# Patient Record
Sex: Female | Born: 1960
Health system: Southern US, Community
[De-identification: ages and names within clinical notes are randomized; demographics above are authoritative.]

## PROBLEM LIST (undated history)

## (undated) DIAGNOSIS — Z8739 Personal history of other diseases of the musculoskeletal system and connective tissue: Secondary | ICD-10-CM

## (undated) DIAGNOSIS — T2200XA Burn of unspecified degree of shoulder and upper limb, except wrist and hand, unspecified site, initial encounter: Secondary | ICD-10-CM

## (undated) DIAGNOSIS — Z972 Presence of dental prosthetic device (complete) (partial): Secondary | ICD-10-CM

## (undated) DIAGNOSIS — I10 Essential (primary) hypertension: Secondary | ICD-10-CM

## (undated) DIAGNOSIS — N6099 Unspecified benign mammary dysplasia of unspecified breast: Secondary | ICD-10-CM

## (undated) DIAGNOSIS — Z98811 Dental restoration status: Secondary | ICD-10-CM

## (undated) DIAGNOSIS — M1710 Unilateral primary osteoarthritis, unspecified knee: Secondary | ICD-10-CM

---
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
## 1999-01-25 ENCOUNTER — Other Ambulatory Visit: Admission: RE | Admit: 1999-01-25 | Discharge: 1999-01-25 | Payer: Self-pay | Admitting: Obstetrics & Gynecology

## 2000-04-02 ENCOUNTER — Other Ambulatory Visit: Admission: RE | Admit: 2000-04-02 | Discharge: 2000-04-02 | Payer: Self-pay | Admitting: Family Medicine

## 2001-08-20 ENCOUNTER — Other Ambulatory Visit: Admission: RE | Admit: 2001-08-20 | Discharge: 2001-08-20 | Payer: Self-pay | Admitting: Obstetrics & Gynecology

## 2002-09-15 ENCOUNTER — Other Ambulatory Visit: Admission: RE | Admit: 2002-09-15 | Discharge: 2002-09-15 | Payer: Self-pay | Admitting: Obstetrics & Gynecology

## 2004-05-01 ENCOUNTER — Other Ambulatory Visit: Admission: RE | Admit: 2004-05-01 | Discharge: 2004-05-01 | Payer: Self-pay | Admitting: Obstetrics & Gynecology

## 2005-05-11 ENCOUNTER — Other Ambulatory Visit: Admission: RE | Admit: 2005-05-11 | Discharge: 2005-05-11 | Payer: Self-pay | Admitting: Obstetrics & Gynecology

## 2010-08-13 HISTORY — PX: BREAST BIOPSY: SHX20

## 2011-05-14 DIAGNOSIS — M179 Osteoarthritis of knee, unspecified: Secondary | ICD-10-CM

## 2011-05-14 DIAGNOSIS — M1710 Unilateral primary osteoarthritis, unspecified knee: Secondary | ICD-10-CM

## 2011-05-14 HISTORY — PX: JOINT REPLACEMENT: SHX530

## 2011-05-14 HISTORY — DX: Osteoarthritis of knee, unspecified: M17.9

## 2011-05-14 HISTORY — DX: Unilateral primary osteoarthritis, unspecified knee: M17.10

## 2011-12-31 ENCOUNTER — Other Ambulatory Visit: Payer: Self-pay | Admitting: Obstetrics & Gynecology

## 2011-12-31 DIAGNOSIS — R928 Other abnormal and inconclusive findings on diagnostic imaging of breast: Secondary | ICD-10-CM

## 2012-01-11 ENCOUNTER — Ambulatory Visit
Admission: RE | Admit: 2012-01-11 | Discharge: 2012-01-11 | Disposition: A | Discharge status: Home / Self Care | Payer: BC Managed Care – PPO | Source: Ambulatory Visit | Attending: Obstetrics & Gynecology | Admitting: Obstetrics & Gynecology

## 2012-01-11 ENCOUNTER — Other Ambulatory Visit: Payer: Self-pay | Admitting: Obstetrics & Gynecology

## 2012-01-11 DIAGNOSIS — R928 Other abnormal and inconclusive findings on diagnostic imaging of breast: Secondary | ICD-10-CM

## 2012-01-11 DIAGNOSIS — N6310 Unspecified lump in the right breast, unspecified quadrant: Secondary | ICD-10-CM

## 2012-02-22 ENCOUNTER — Ambulatory Visit
Admission: RE | Admit: 2012-02-22 | Discharge: 2012-02-22 | Disposition: A | Discharge status: Home / Self Care | Payer: BC Managed Care – PPO | Source: Ambulatory Visit | Attending: Obstetrics & Gynecology | Admitting: Obstetrics & Gynecology

## 2012-02-22 ENCOUNTER — Other Ambulatory Visit: Payer: Self-pay | Admitting: Obstetrics & Gynecology

## 2012-02-22 DIAGNOSIS — N6310 Unspecified lump in the right breast, unspecified quadrant: Secondary | ICD-10-CM

## 2012-03-07 ENCOUNTER — Ambulatory Visit
Admission: RE | Admit: 2012-03-07 | Discharge: 2012-03-07 | Disposition: A | Discharge status: Home / Self Care | Payer: BC Managed Care – PPO | Source: Ambulatory Visit | Attending: Obstetrics & Gynecology | Admitting: Obstetrics & Gynecology

## 2012-03-07 DIAGNOSIS — N6310 Unspecified lump in the right breast, unspecified quadrant: Secondary | ICD-10-CM

## 2012-04-02 ENCOUNTER — Ambulatory Visit (INDEPENDENT_AMBULATORY_CARE_PROVIDER_SITE_OTHER): Payer: BC Managed Care – PPO | Admitting: Surgery

## 2012-04-02 ENCOUNTER — Encounter (INDEPENDENT_AMBULATORY_CARE_PROVIDER_SITE_OTHER): Payer: Self-pay | Admitting: Surgery

## 2012-04-02 ENCOUNTER — Encounter (INDEPENDENT_AMBULATORY_CARE_PROVIDER_SITE_OTHER): Payer: Self-pay

## 2012-04-02 ENCOUNTER — Other Ambulatory Visit (INDEPENDENT_AMBULATORY_CARE_PROVIDER_SITE_OTHER): Payer: Self-pay | Admitting: Surgery

## 2012-04-02 VITALS — BP 104/78 | HR 78 | Temp 98.8°F | Resp 16 | Ht 63.5 in | Wt 175.2 lb

## 2012-04-02 DIAGNOSIS — N6099 Unspecified benign mammary dysplasia of unspecified breast: Secondary | ICD-10-CM

## 2012-04-02 DIAGNOSIS — N6089 Other benign mammary dysplasias of unspecified breast: Secondary | ICD-10-CM

## 2012-04-02 NOTE — Patient Instructions (Signed)
Lumpectomy, Breast Conserving Surgery Care After Please read the instructions outlined below and refer to this sheet in the next few weeks. These discharge instructions provide you with general information on caring for yourself after you leave the hospital. Your surgeon may also give you specific instructions. While your treatment has been planned according to the most current medical practices available, unavoidable complications occasionally occur. If you have any problems or questions after discharge, please call your surgeon. Reasons for a lumpectomy: Any solid breast mass.  Grouped significant nodularity that may be confused with a solitary breast mass.  AFTER THE PROCEDURE After surgery, you will be taken to the recovery area where a nurse will watch and check your progress. Once you're awake, stable, and taking fluids well, barring other problems you will be allowed to go home.  Ice packs applied to your operative site may help with discomfort and keep the swelling down.  A small rubber drain may be placed in the incision for a couple of days to prevent a hematoma in the breast.  A pressure dressing may be applied for 24 to 48 hours to prevent bleeding.  Keep the wound dry.  You may resume a normal diet and activities as directed. Avoid strenuous activities affecting the arm on the side of the biopsy site such as tennis, swimming, heavy lifting (more than 10 pounds) or pulling.  Bruising in the breast is normal following this procedure.  Wearing a bra - even to bed - may be more comfortable and also help keep the dressing on.  Change dressings as directed.  Only take over-the-counter or prescription medicines for pain, discomfort, or fever as directed by your caregiver.  Call for your results as instructed by your surgeon. Remember it isyour responsibility to get the results of your lumpectomy if your surgeon asked you to follow-up. Do not assume everything is fine if you have not heard from  your caregiver. SEEK MEDICAL CARE IF:  There is increased bleeding (more than a small spot) from the wound.  You notice redness, swelling, or increasing pain in the wound.  Pus is coming from wound.  An unexplained oral temperature above 102 F (38.9 C) develops.  You notice a foul smell coming from the wound or dressing.  SEEK IMMEDIATE MEDICAL CARE IF:  You develop a rash.  You have difficulty breathing.  You have any allergic problems.  Document Released: 08/15/2006 Document Revised: 07/19/2011 Document Reviewed: 07/18/2007 [employer] Patient Information 2012 [employer].Lumpectomy, Breast Conserving Surgery A lumpectomy is breast surgery that removes only part of the breast. Another name used may be partial mastectomy. The amount removed varies. Make sure you understand how much of your breast will be removed. Reasons for a lumpectomy:  Any solid breast mass.   Grouped significant nodularity that may be confused with a solitary breast mass.  Lumpectomy is the most common form of breast cancer surgery today. The surgeon removes the portion of your breast which contains the tumor (cancer). This is the lump. Some normal tissue around the lump is also removed to be sure that all the tumor has been removed.  If cancer cells are found in the margins where the breast tissue was removed, your surgeon will do more surgery to remove the remaining cancer tissue. This is called re-excision surgery. Radiation and/or chemotherapy treatments are often given following a lumpectomy to kill any cancer cells that could possibly remain.  REASONS YOU MAY NOT BE ABLE TO HAVE BREAST CONSERVING SURGERY:  The   tumor is located in more than one place.   Your breast is small and the tumor is large so the breast would be disfigured.   The entire tumor removal is not successful with a lumpectomy.   You cannot commit to a full course of chemotherapy, radiation therapy or are pregnant and cannot have radiation.     You have previously had radiation to the breast to treat cancer.  HOW A LUMPECTOMY IS PERFORMED If overnight nursing is not required following a biopsy, a lumpectomy can be performed as a same-day surgery. This can be done in a hospital, clinic, or surgical center. The anesthesia used will depend on your surgeon. They will discuss this with you. A general anesthetic keeps you sleeping through the procedure. LET YOUR CAREGIVERS KNOW ABOUT THE FOLLOWING:  Allergies   Medications taken including herbs, eye drops, over the counter medications, and creams.   Use of steroids (by mouth or creams)   Previous problems with anesthetics or Novocaine.   Possibility of pregnancy, if this applies   History of blood clots (thrombophlebitis)   History of bleeding or blood problems.   Previous surgery   Other health problems  BEFORE THE PROCEDURE You should be present one hour prior to your procedure unless directed otherwise.  AFTER THE PROCEDURE  After surgery, you will be taken to the recovery area where a nurse will watch and check your progress. Once you're awake, stable, and taking fluids well, barring other problems you will be allowed to go home.   Ice packs applied to your operative site may help with discomfort and keep the swelling down.   A small rubber drain may be placed in the breast for a couple of days to prevent a hematoma from developing in the breast.   A pressure dressing may be applied for 24 to 48 hours to prevent bleeding.   Keep the wound dry.   You may resume a normal diet and activities as directed. Avoid strenuous activities affecting the arm on the side of the biopsy site such as tennis, swimming, heavy lifting (more than 10 pounds) or pulling.   Bruising in the breast is normal following this procedure.   Wearing a bra - even to bed - may be more comfortable and also help keep the dressing on.   Change dressings as directed.   Only take over-the-counter or  prescription medicines for pain, discomfort, or fever as directed by your caregiver.  Call for your results as instructed by your surgeon. Remember it is your responsibility to get the results of your lumpectomy if your surgeon asked you to follow-up. Do not assume everything is fine if you have not heard from your caregiver. SEEK MEDICAL CARE IF:   There is increased bleeding (more than a small spot) from the wound.   You notice redness, swelling, or increasing pain in the wound.   Pus is coming from wound.   An unexplained oral temperature above 102 F (38.9 C) develops.   You notice a foul smell coming from the wound or dressing.  SEEK IMMEDIATE MEDICAL CARE IF:   You develop a rash.   You have difficulty breathing.   You have any allergic problems.  Document Released: 09/10/2006 Document Revised: 07/19/2011 Document Reviewed: 12/12/2006 [employer] Patient Information 2012 [employer]. 

## 2012-04-02 NOTE — Progress Notes (Signed)
Patient ID: Olivia Hall, female   DOB: [birth year], 52 y.o.   MRN: [id number]  Chief Complaint  Patient presents with  . Pre-op Exam    eval rt atypical hyperplasia    HPI Olivia Hall is a 51 y.o. female.  The patient is sent at the request of Dr.Rubner for abnormal mammogram. She was found to have an area in her right breast that was atypical in appearance and core biopsy was done which showed atypical ductal hyperplasia. Patient denies any history of breast mass, breast pain or nipple discharge bilaterally. No family history of previous biopsy or breast cancer. HPI  History reviewed. No pertinent past medical history.  Past Surgical History  Procedure Date  . Back surgery   . Joint replacement 05/2011    knee    Family History  Problem Relation Age of Onset  . Heart disease Father   . Cancer Father     brain    Social History History  Substance Use Topics  . Smoking status: Never Smoker   . Smokeless tobacco: Never Used  . Alcohol Use: No    No Known Allergies  Current Outpatient Prescriptions  Medication Sig Dispense Refill  . pregabalin (LYRICA) 100 MG capsule Take 100 mg by mouth 2 (two) times daily. Pt does not remember correct dosage        Review of Systems Review of Systems  Constitutional: Negative for fever, chills and unexpected weight change.  HENT: Negative for hearing loss, congestion, sore throat, trouble swallowing and voice change.   Eyes: Negative for visual disturbance.  Respiratory: Negative for cough and wheezing.   Cardiovascular: Negative for chest pain, palpitations and leg swelling.  Gastrointestinal: Negative for nausea, vomiting, abdominal pain, diarrhea, constipation, blood in stool, abdominal distention and anal bleeding.  Genitourinary: Negative for hematuria, vaginal bleeding and difficulty urinating.  Musculoskeletal: Negative for arthralgias.  Skin: Negative for rash and wound.  Neurological: Negative for seizures, syncope  and headaches.  Hematological: Negative for adenopathy. Does not bruise/bleed easily.  Psychiatric/Behavioral: Negative for confusion.    Blood pressure 104/78, pulse 78, temperature 98.8 F (37.1 C), temperature source Temporal, resp. rate 16, height 5' 3.5" (1.613 m), weight 175 lb 3.2 oz (79.47 kg), last menstrual period 03/22/2012.  Physical Exam Physical Exam  Constitutional: She is oriented to person, place, and time. She appears well-developed and well-nourished.  HENT:  Head: Normocephalic and atraumatic.  Eyes: EOM are normal. Pupils are equal, round, and reactive to light.  Neck: Normal range of motion. Neck supple.  Cardiovascular: Normal rate and regular rhythm.   Pulmonary/Chest: Effort normal and breath sounds normal.    Musculoskeletal: Normal range of motion.  Lymphadenopathy:    She has no axillary adenopathy.  Neurological: She is alert and oriented to person, place, and time.  Skin: Skin is warm and dry.  Psychiatric: She has a normal mood and affect. Her behavior is normal. Judgment and thought content normal.    Data Reviewed  **ADDENDUM** CREATED: 03/10/2012 12:28:35  Atypical ductal hyperplasia, possibly involving an underlying  papilloma was reported histologically. This corresponds well with  the imaging findings. The patient was contacted by telephone and  given the results of the biopsy. She states the wound site is  clean and dry with no significant hematoma. Surgical consultation  has been scheduled with Dr. Luisa Hart on 04/02/2012.  Addended by: Littie Deeds. Judyann Munson, M.D. on 03/10/2012 12:28:35.  **END ADDENDUM** SIGNED BY: Littie Deeds. Judyann Munson, M.D.  Study Result     *RADIOLOGY REPORT*  Clinical Data: mass 6:00 right breast  DIGITAL DIAGNOSTIC RIGHT MAMMOGRAM  Comparison: Previous exams  Findings: Films are performed following ultrasound guided biopsy  of the mass in the 6:00 position of the right breast. The marker  clip projects over the anticipated  area.  IMPRESSION:  Appropriate clip positioning.      Assessment    Right breast atypical ductal hyperplasia    Plan    Right breast needle localized lumpectomy.The procedure has been discussed with the patient. Alternatives to surgery have been discussed with the patient.  Risks of surgery include bleeding,  Infection,  Seroma formation, death,  and the need for further surgery.   The patient understands and wishes to proceed.       Ginevra Tacker A. 04/02/2012, 2:11 PM

## 2012-04-13 DIAGNOSIS — N6099 Unspecified benign mammary dysplasia of unspecified breast: Secondary | ICD-10-CM

## 2012-04-13 HISTORY — DX: Unspecified benign mammary dysplasia of unspecified breast: N60.99

## 2012-04-22 ENCOUNTER — Encounter (HOSPITAL_BASED_OUTPATIENT_CLINIC_OR_DEPARTMENT_OTHER): Payer: Self-pay | Admitting: *Deleted

## 2012-04-22 DIAGNOSIS — T2200XA Burn of unspecified degree of shoulder and upper limb, except wrist and hand, unspecified site, initial encounter: Secondary | ICD-10-CM

## 2012-04-22 HISTORY — DX: Burn of unspecified degree of shoulder and upper limb, except wrist and hand, unspecified site, initial encounter: T22.00XA

## 2012-04-22 NOTE — Pre-Procedure Instructions (Signed)
To come for CBC, diff, CMET, CXR 

## 2012-04-23 ENCOUNTER — Ambulatory Visit
Admission: RE | Admit: 2012-04-23 | Discharge: 2012-04-23 | Disposition: A | Discharge status: Home / Self Care | Payer: BC Managed Care – PPO | Source: Ambulatory Visit | Attending: Surgery | Admitting: Surgery

## 2012-04-23 ENCOUNTER — Encounter (HOSPITAL_BASED_OUTPATIENT_CLINIC_OR_DEPARTMENT_OTHER)
Admission: RE | Admit: 2012-04-23 | Discharge: 2012-04-23 | Disposition: A | Discharge status: Home / Self Care | Payer: BC Managed Care – PPO | Source: Ambulatory Visit | Attending: Surgery | Admitting: Surgery

## 2012-04-23 LAB — COMPREHENSIVE METABOLIC PANEL
ALT: 25 U/L (ref 0–35)
Alkaline Phosphatase: 88 U/L (ref 39–117)
BUN: 12 mg/dL (ref 6–23)
Chloride: 102 mEq/L (ref 96–112)
GFR calc Af Amer: >90 mL/min (ref >90)
Glucose, Bld: 84 mg/dL (ref 70–99)
Potassium: 3.8 mEq/L (ref 3.5–5.1)
Sodium: 139 mEq/L (ref 135–145)
Total Bilirubin: 0.3 mg/dL (ref 0.3–1.2)
Total Protein: 7.3 g/dL (ref 6.0–8.3)

## 2012-04-23 LAB — CBC WITH DIFFERENTIAL/PLATELET
Eosinophils Absolute: 0.1 10*3/uL (ref 0.0–0.7)
Hemoglobin: 14.1 g/dL (ref 12.0–15.0)
Lymphocytes Relative: 31 % (ref 12–46)
Lymphs Abs: 2.4 10*3/uL (ref 0.7–4.0)
Monocytes Relative: 9 % (ref 3–12)
Neutro Abs: 4.4 10*3/uL (ref 1.7–7.7)
Neutrophils Relative %: 58 % (ref 43–77)
Platelets: 293 10*3/uL (ref 150–400)
RBC: 4.7 MIL/uL (ref 3.87–5.11)
WBC: 7.6 10*3/uL (ref 4.0–10.5)

## 2012-04-28 ENCOUNTER — Ambulatory Visit (HOSPITAL_BASED_OUTPATIENT_CLINIC_OR_DEPARTMENT_OTHER): Payer: BC Managed Care – PPO | Admitting: Anesthesiology

## 2012-04-28 ENCOUNTER — Ambulatory Visit
Admission: RE | Admit: 2012-04-28 | Discharge: 2012-04-28 | Disposition: A | Discharge status: Home / Self Care | Payer: BC Managed Care – PPO | Source: Ambulatory Visit | Attending: Surgery | Admitting: Surgery

## 2012-04-28 ENCOUNTER — Encounter (HOSPITAL_BASED_OUTPATIENT_CLINIC_OR_DEPARTMENT_OTHER): Payer: Self-pay | Admitting: Anesthesiology

## 2012-04-28 ENCOUNTER — Encounter (HOSPITAL_BASED_OUTPATIENT_CLINIC_OR_DEPARTMENT_OTHER): Payer: Self-pay

## 2012-04-28 ENCOUNTER — Encounter (HOSPITAL_BASED_OUTPATIENT_CLINIC_OR_DEPARTMENT_OTHER)
Admission: RE | Disposition: A | Discharge status: Home / Self Care | Payer: Self-pay | Source: Ambulatory Visit | Attending: Surgery

## 2012-04-28 ENCOUNTER — Ambulatory Visit (HOSPITAL_BASED_OUTPATIENT_CLINIC_OR_DEPARTMENT_OTHER)
Admission: RE | Admit: 2012-04-28 | Discharge: 2012-04-28 | Disposition: A | Discharge status: Home / Self Care | Payer: BC Managed Care – PPO | Source: Ambulatory Visit | Attending: Surgery | Admitting: Surgery

## 2012-04-28 ENCOUNTER — Encounter (HOSPITAL_BASED_OUTPATIENT_CLINIC_OR_DEPARTMENT_OTHER): Payer: Self-pay | Admitting: Surgery

## 2012-04-28 DIAGNOSIS — N6099 Unspecified benign mammary dysplasia of unspecified breast: Secondary | ICD-10-CM

## 2012-04-28 DIAGNOSIS — N6089 Other benign mammary dysplasias of unspecified breast: Secondary | ICD-10-CM | POA: Insufficient documentation

## 2012-04-28 HISTORY — DX: Burn of unspecified degree of shoulder and upper limb, except wrist and hand, unspecified site, initial encounter: T22.00XA

## 2012-04-28 HISTORY — DX: Personal history of other diseases of the musculoskeletal system and connective tissue: Z87.39

## 2012-04-28 HISTORY — DX: Dental restoration status: Z98.811

## 2012-04-28 HISTORY — DX: Unspecified benign mammary dysplasia of unspecified breast: N60.99

## 2012-04-28 HISTORY — DX: Presence of dental prosthetic device (complete) (partial): Z97.2

## 2012-04-28 HISTORY — DX: Unilateral primary osteoarthritis, unspecified knee: M17.10

## 2012-04-28 SURGERY — BREAST LUMPECTOMY WITH NEEDLE LOCALIZATION
Anesthesia: General | Site: Breast | Laterality: Right | Wound class: Clean

## 2012-04-28 MED ORDER — PROMETHAZINE HCL 25 MG/ML IJ SOLN: 6.2500 mg | INTRAMUSCULAR | Status: DC | PRN

## 2012-04-28 MED ORDER — CHLORHEXIDINE GLUCONATE 4 % EX LIQD: 1.0000 "application " | Freq: Once | CUTANEOUS | Status: DC

## 2012-04-28 MED ORDER — BUPIVACAINE-EPINEPHRINE 0.25% -1:200000 IJ SOLN
INTRAMUSCULAR | Status: DC | PRN
  Administered 2012-04-28: 20 mL

## 2012-04-28 MED ORDER — DEXTROSE 5 % IV SOLN
3.0000 g | INTRAVENOUS | Status: AC
  Administered 2012-04-28: 3 g via INTRAVENOUS

## 2012-04-28 MED ORDER — OXYCODONE HCL 5 MG/5ML PO SOLN: 5.0000 mg | Freq: Once | ORAL | Status: AC | PRN

## 2012-04-28 MED ORDER — LACTATED RINGERS IV SOLN
INTRAVENOUS | Status: DC
  Administered 2012-04-28: 11:00:00 via INTRAVENOUS

## 2012-04-28 MED ORDER — SUCCINYLCHOLINE CHLORIDE 20 MG/ML IJ SOLN
INTRAMUSCULAR | Status: DC | PRN
  Administered 2012-04-28: 20 mg via INTRAVENOUS

## 2012-04-28 MED ORDER — MEPERIDINE HCL 25 MG/ML IJ SOLN: 6.2500 mg | INTRAMUSCULAR | Status: DC | PRN

## 2012-04-28 MED ORDER — PROPOFOL 10 MG/ML IV BOLUS
INTRAVENOUS | Status: DC | PRN
  Administered 2012-04-28: 200 mg via INTRAVENOUS

## 2012-04-28 MED ORDER — OXYCODONE-ACETAMINOPHEN 5-325 MG PO TABS: 1.0000 | ORAL_TABLET | ORAL | Status: DC | PRN

## 2012-04-28 MED ORDER — DEXAMETHASONE SODIUM PHOSPHATE 4 MG/ML IJ SOLN
INTRAMUSCULAR | Status: DC | PRN
  Administered 2012-04-28: 10 mg via INTRAVENOUS

## 2012-04-28 MED ORDER — LIDOCAINE HCL (CARDIAC) 20 MG/ML IV SOLN
INTRAVENOUS | Status: DC | PRN
  Administered 2012-04-28: 75 mg via INTRAVENOUS

## 2012-04-28 MED ORDER — FENTANYL CITRATE 0.05 MG/ML IJ SOLN
INTRAMUSCULAR | Status: DC | PRN
  Administered 2012-04-28 (×2): 50 ug via INTRAVENOUS

## 2012-04-28 MED ORDER — HYDROMORPHONE HCL PF 1 MG/ML IJ SOLN: 0.2500 mg | INTRAMUSCULAR | Status: DC | PRN

## 2012-04-28 MED ORDER — OXYCODONE HCL 5 MG PO TABS
5.0000 mg | ORAL_TABLET | Freq: Once | ORAL | Status: AC | PRN
  Administered 2012-04-28: 5 mg via ORAL

## 2012-04-28 MED ORDER — MIDAZOLAM HCL 5 MG/5ML IJ SOLN
INTRAMUSCULAR | Status: DC | PRN
  Administered 2012-04-28: 2 mg via INTRAVENOUS

## 2012-04-28 SURGICAL SUPPLY — 45 items
ADH SKN CLS APL DERMABOND .7 (GAUZE/BANDAGES/DRESSINGS) ×1
BLADE SURG 15 STRL LF DISP TIS (BLADE) ×1 IMPLANT
BLADE SURG 15 STRL SS (BLADE) ×2
CANISTER SUCTION 1200CC (MISCELLANEOUS) ×2 IMPLANT
CHLORAPREP W/TINT 26ML (MISCELLANEOUS) ×2 IMPLANT
CLIP TI WIDE RED SMALL 6 (CLIP) IMPLANT
CLOTH BEACON ORANGE TIMEOUT ST (SAFETY) ×2 IMPLANT
COVER MAYO STAND STRL (DRAPES) ×2 IMPLANT
COVER TABLE BACK 60X90 (DRAPES) ×2 IMPLANT
DECANTER SPIKE VIAL GLASS SM (MISCELLANEOUS) ×1 IMPLANT
DERMABOND ADVANCED (GAUZE/BANDAGES/DRESSINGS) ×1
DERMABOND ADVANCED .7 DNX12 (GAUZE/BANDAGES/DRESSINGS) ×1 IMPLANT
DEVICE DUBIN W/COMP PLATE 8390 (MISCELLANEOUS) ×1 IMPLANT
DRAPE LAPAROSCOPIC ABDOMINAL (DRAPES) IMPLANT
DRAPE PED LAPAROTOMY (DRAPES) ×2 IMPLANT
DRAPE UTILITY XL STRL (DRAPES) ×2 IMPLANT
ELECT COATED BLADE 2.86 ST (ELECTRODE) ×2 IMPLANT
ELECT REM PT RETURN 9FT ADLT (ELECTROSURGICAL) ×2
ELECTRODE REM PT RTRN 9FT ADLT (ELECTROSURGICAL) ×1 IMPLANT
GLOVE BIO SURGEON STRL SZ 6.5 (GLOVE) ×1 IMPLANT
GLOVE BIOGEL PI IND STRL 7.0 (GLOVE) IMPLANT
GLOVE BIOGEL PI IND STRL 8 (GLOVE) ×1 IMPLANT
GLOVE BIOGEL PI INDICATOR 7.0 (GLOVE) ×1
GLOVE BIOGEL PI INDICATOR 8 (GLOVE) ×2
GLOVE ECLIPSE 6.5 STRL STRAW (GLOVE) ×1 IMPLANT
GLOVE ECLIPSE 8.0 STRL XLNG CF (GLOVE) ×3 IMPLANT
GOWN PREVENTION PLUS XLARGE (GOWN DISPOSABLE) ×4 IMPLANT
KIT MARKER MARGIN INK (KITS) IMPLANT
NDL HYPO 25X1 1.5 SAFETY (NEEDLE) ×1 IMPLANT
NEEDLE HYPO 25X1 1.5 SAFETY (NEEDLE) ×2 IMPLANT
NS IRRIG 1000ML POUR BTL (IV SOLUTION) ×2 IMPLANT
PACK BASIN DAY SURGERY FS (CUSTOM PROCEDURE TRAY) ×2 IMPLANT
PENCIL BUTTON HOLSTER BLD 10FT (ELECTRODE) ×2 IMPLANT
SLEEVE SCD COMPRESS KNEE MED (MISCELLANEOUS) ×2 IMPLANT
SPONGE LAP 4X18 X RAY DECT (DISPOSABLE) ×2 IMPLANT
SUT MON AB 4-0 PC3 18 (SUTURE) ×2 IMPLANT
SUT SILK 2 0 SH (SUTURE) IMPLANT
SUT VIC AB 3-0 SH 27 (SUTURE) ×4
SUT VIC AB 3-0 SH 27X BRD (SUTURE) ×1 IMPLANT
SYR CONTROL 10ML LL (SYRINGE) ×2 IMPLANT
TOWEL OR 17X24 6PK STRL BLUE (TOWEL DISPOSABLE) ×4 IMPLANT
TOWEL OR NON WOVEN STRL DISP B (DISPOSABLE) ×2 IMPLANT
TUBE CONNECTING 20X1/4 (TUBING) ×2 IMPLANT
WATER STERILE IRR 1000ML POUR (IV SOLUTION) IMPLANT
YANKAUER SUCT BULB TIP NO VENT (SUCTIONS) ×2 IMPLANT

## 2012-04-28 NOTE — Interval H&P Note (Signed)
History and Physical Interval Note:  04/28/2012 11:26 AM  Olivia Hall  has presented today for surgery, with the diagnosis of atypical ductal hyperplasia  The various methods of treatment have been discussed with the patient and family. After consideration of risks, benefits and other options for treatment, the patient has consented to  Procedure(s) (LRB) with comments: BREAST LUMPECTOMY WITH NEEDLE LOCALIZATION (Right) - Right breast Needle localized lumpectomy as a surgical intervention .  The patient's history has been reviewed, patient examined, no change in status, stable for surgery.  I have reviewed the patient's chart and labs.  Questions were answered to the patient's satisfaction.     Hieu Herms A.

## 2012-04-28 NOTE — Anesthesia Preprocedure Evaluation (Signed)
Anesthesia Evaluation  Patient identified by MRN, date of birth, ID band Patient awake    Reviewed: Allergy & Precautions, H&P , NPO status , Patient's Chart, lab work & pertinent test results  History of Anesthesia Complications Negative for: history of anesthetic complications  Airway Mallampati: I      Dental  (+) Partial Upper and Dental Advisory Given   Pulmonary neg pulmonary ROS,  breath sounds clear to auscultation        Cardiovascular negative cardio ROS  Rhythm:Regular Rate:Normal     Neuro/Psych Nerve pain leg    GI/Hepatic negative GI ROS, Neg liver ROS,   Endo/Other  negative endocrine ROS  Renal/GU negative Renal ROS     Musculoskeletal negative musculoskeletal ROS (+)   Abdominal   Peds  Hematology negative hematology ROS (+)   Anesthesia Other Findings   Reproductive/Obstetrics                           Anesthesia Physical Anesthesia Plan  ASA: I  Anesthesia Plan: General   Post-op Pain Management:    Induction: Intravenous  Airway Management Planned: LMA  Additional Equipment:   Intra-op Plan:   Post-operative Plan: Extubation in OR  Informed Consent:   Dental advisory given  Plan Discussed with: CRNA and Surgeon  Anesthesia Plan Comments:         Anesthesia Quick Evaluation

## 2012-04-28 NOTE — Anesthesia Procedure Notes (Signed)
Procedure Name: LMA Insertion Date/Time: 04/28/2012 11:50 AM Performed by: Zenia Resides D Pre-anesthesia Checklist: Patient identified, Emergency Drugs available, Suction available and Patient being monitored Patient Re-evaluated:Patient Re-evaluated prior to inductionOxygen Delivery Method: Circle System Utilized Preoxygenation: Pre-oxygenation with 100% oxygen Intubation Type: IV induction Ventilation: Mask ventilation without difficulty LMA: LMA inserted LMA Size: 4.0 Number of attempts: 1 Airway Equipment and Method: bite block Placement Confirmation: positive ETCO2 and breath sounds checked- equal and bilateral Tube secured with: Tape Dental Injury: Teeth and Oropharynx as per pre-operative assessment

## 2012-04-28 NOTE — Op Note (Signed)
Right Breast Partial mastectomy with wire localization  Indications: This patient presents with history of a right breast ADH Given the clinical history and physical exam, along with indicated diagnostic studies, breast biopsy will be performed.  Pre-operative Diagnosis: right breast ADH  Post-operative Diagnosis: right breast ADH  Surgeon: Aerionna Moravek A.   Assistants: or staff  Anesthesia: General LMA anesthesia and Local anesthesia 0.25.% bupivacaine, with epinephrine  ASA Class: 2  Procedure Details  The patient was seen in the Holding Room. The risks, benefits, complications, treatment options, and expected outcomes were discussed with the patient. The possibilities of reaction to medication, pulmonary aspiration, bleeding, infection, the need for additional procedures, failure to diagnose a condition, and creating a complication requiring transfusion or operation were discussed with the patient. The patient concurred with the proposed plan, giving informed consent. The site of surgery properly noted/marked. The patient was taken to Operating Room, identified as Olivia Hall, and the procedure verified as lumpectomy. A Time Out was held and the above information confirmed.  After induction of anesthesia, the right breast and chest were prepped and draped in standard fashion. The lumpectomy was performed by creating an oblique incision over the lower outer quadrant of the breast adjacent to the localizing wire.All tissue a around the wire was excised. Hemostasis was achieved with cautery. The wound was irrigated and closed with a 3-0 Vicryl  And 4 0  monocryl subcuticular closure in layers.  Specimen oriented with suture..    Sterile dressings were applied. At the end of the operation, all sponge, instrument, and needle counts were correct.  Findings: grossly clear surgical margins  Estimated Blood Loss:  Minimal         Drains: none         Total IV Fluids: 1000 mL           Specimens: breast mass wire and clip            Complications:  None; patient tolerated the procedure well.         Disposition: PACU - hemodynamically stable.         Condition: Stable

## 2012-04-28 NOTE — Transfer of Care (Signed)
Immediate Anesthesia Transfer of Care Note  Patient: Olivia Hall  Procedure(s) Performed: Procedure(s) (LRB) with comments: BREAST LUMPECTOMY WITH NEEDLE LOCALIZATION (Right) - Right breast Needle localized lumpectomy  Patient Location: PACU  Anesthesia Type: General  Level of Consciousness: awake, alert  and oriented  Airway & Oxygen Therapy: Patient Spontanous Breathing and Patient connected to face mask oxygen  Post-op Assessment: Report given to PACU RN and Post -op Vital signs reviewed and stable  Post vital signs: Reviewed and stable  Complications: No apparent anesthesia complications

## 2012-04-28 NOTE — Anesthesia Postprocedure Evaluation (Signed)
  Anesthesia Post-op Note  Patient: Olivia Hall  Procedure(s) Performed: Procedure(s) (LRB) with comments: BREAST LUMPECTOMY WITH NEEDLE LOCALIZATION (Right) - Right breast Needle localized lumpectomy  Patient Location: PACU  Anesthesia Type: General  Level of Consciousness: awake  Airway and Oxygen Therapy: Patient Spontanous Breathing  Post-op Pain: mild  Post-op Assessment: Post-op Vital signs reviewed  Post-op Vital Signs: stable  Complications: No apparent anesthesia complications

## 2012-04-28 NOTE — H&P (View-Only) (Signed)
Patient ID: Olivia Hall, female   DOB: 10/26/1960, 51 y.o.   MRN: [id number]  Chief Complaint  Patient presents with  . Pre-op Exam    eval rt atypical hyperplasia    HPI Olivia Hall is a 51 y.o. female.  The patient is sent at the request of Dr.Rubner for abnormal mammogram. She was found to have an area in her right breast that was atypical in appearance and core biopsy was done which showed atypical ductal hyperplasia. Patient denies any history of breast mass, breast pain or nipple discharge bilaterally. No family history of previous biopsy or breast cancer. HPI  History reviewed. No pertinent past medical history.  Past Surgical History  Procedure Date  . Back surgery   . Joint replacement 05/2011    knee    Family History  Problem Relation Age of Onset  . Heart disease Father   . Cancer Father     brain    Social History History  Substance Use Topics  . Smoking status: Never Smoker   . Smokeless tobacco: Never Used  . Alcohol Use: No    No Known Allergies  Current Outpatient Prescriptions  Medication Sig Dispense Refill  . pregabalin (LYRICA) 100 MG capsule Take 100 mg by mouth 2 (two) times daily. Pt does not remember correct dosage        Review of Systems Review of Systems  Constitutional: Negative for fever, chills and unexpected weight change.  HENT: Negative for hearing loss, congestion, sore throat, trouble swallowing and voice change.   Eyes: Negative for visual disturbance.  Respiratory: Negative for cough and wheezing.   Cardiovascular: Negative for chest pain, palpitations and leg swelling.  Gastrointestinal: Negative for nausea, vomiting, abdominal pain, diarrhea, constipation, blood in stool, abdominal distention and anal bleeding.  Genitourinary: Negative for hematuria, vaginal bleeding and difficulty urinating.  Musculoskeletal: Negative for arthralgias.  Skin: Negative for rash and wound.  Neurological: Negative for seizures, syncope  and headaches.  Hematological: Negative for adenopathy. Does not bruise/bleed easily.  Psychiatric/Behavioral: Negative for confusion.    Blood pressure 104/78, pulse 78, temperature 98.8 F (37.1 C), temperature source Temporal, resp. rate 16, height 5' 3.5" (1.613 m), weight 175 lb 3.2 oz (79.47 kg), last menstrual period 03/22/2012.  Physical Exam Physical Exam  Constitutional: She is oriented to person, place, and time. She appears well-developed and well-nourished.  HENT:  Head: Normocephalic and atraumatic.  Eyes: EOM are normal. Pupils are equal, round, and reactive to light.  Neck: Normal range of motion. Neck supple.  Cardiovascular: Normal rate and regular rhythm.   Pulmonary/Chest: Effort normal and breath sounds normal.    Musculoskeletal: Normal range of motion.  Lymphadenopathy:    She has no axillary adenopathy.  Neurological: She is alert and oriented to person, place, and time.  Skin: Skin is warm and dry.  Psychiatric: She has a normal mood and affect. Her behavior is normal. Judgment and thought content normal.    Data Reviewed  **ADDENDUM** CREATED: 03/10/2012 12:28:35  Atypical ductal hyperplasia, possibly involving an underlying  papilloma was reported histologically. This corresponds well with  the imaging findings. The patient was contacted by telephone and  given the results of the biopsy. She states the wound site is  clean and dry with no significant hematoma. Surgical consultation  has been scheduled with Dr. Shaneequa Bahner on 04/02/2012.  Addended by: Dina L. Arceo, M.D. on 03/10/2012 12:28:35.  **END ADDENDUM** SIGNED BY: Dina L. Arceo, M.D.        Study Result     *RADIOLOGY REPORT*  Clinical Data: mass 6:00 right breast  DIGITAL DIAGNOSTIC RIGHT MAMMOGRAM  Comparison: Previous exams  Findings: Films are performed following ultrasound guided biopsy  of the mass in the 6:00 position of the right breast. The marker  clip projects over the anticipated  area.  IMPRESSION:  Appropriate clip positioning.      Assessment    Right breast atypical ductal hyperplasia    Plan    Right breast needle localized lumpectomy.The procedure has been discussed with the patient. Alternatives to surgery have been discussed with the patient.  Risks of surgery include bleeding,  Infection,  Seroma formation, death,  and the need for further surgery.   The patient understands and wishes to proceed.       Katessa Attridge A. 04/02/2012, 2:11 PM    

## 2012-05-01 ENCOUNTER — Telehealth (INDEPENDENT_AMBULATORY_CARE_PROVIDER_SITE_OTHER): Payer: Self-pay

## 2012-05-01 NOTE — Telephone Encounter (Signed)
I called patient and gave her her path results. Path came back benign and with clear margins. Patient understood results and we will see her at her scheduled follow up appointment.

## 2012-05-09 ENCOUNTER — Ambulatory Visit (INDEPENDENT_AMBULATORY_CARE_PROVIDER_SITE_OTHER): Payer: BC Managed Care – PPO | Admitting: Surgery

## 2012-05-09 ENCOUNTER — Encounter (INDEPENDENT_AMBULATORY_CARE_PROVIDER_SITE_OTHER): Payer: Self-pay | Admitting: Surgery

## 2012-05-09 VITALS — BP 130/80 | HR 72 | Temp 98.0°F | Resp 18 | Ht 63.0 in | Wt 174.0 lb

## 2012-05-09 DIAGNOSIS — Z9889 Other specified postprocedural states: Secondary | ICD-10-CM

## 2012-05-09 NOTE — Patient Instructions (Signed)
Return as needed

## 2012-05-09 NOTE — Progress Notes (Signed)
Pa tient returns after right breast o'clock localized lumpectomy. Pathology showed BENIGN BREAST SEE COMMENT.  She is having no problems.  Exam: Right breast incision clean dry and intact without signs of infection.  Impression  History of atypical duct hyperplasia status post excision biopsy showing benign breast tissue without any more atypical ductal hyperplasia  Plan: Given her biopsy showed no other aspects of atypical ductal hyperplasia, I do not feel any further followup is necessary. Returned routine screening next year. Followup as needed.

## 2012-10-16 ENCOUNTER — Telehealth (INDEPENDENT_AMBULATORY_CARE_PROVIDER_SITE_OTHER): Payer: Self-pay

## 2012-10-16 NOTE — Telephone Encounter (Signed)
Pt called in b/c she has been having sharp pains in the right breast that she had a lumpectomy in September. The pt has no redness or drainage from the breast just pain. The pt stated the pains have been sharp and they woke her up this a.m. From her sleep. I advised pt that Dr Luisa Hart would be in the office on Friday but the pt doesn't want to come in on Friday b/c poss. Bad weather. I paged Dr Luisa Hart.

## 2012-10-16 NOTE — Telephone Encounter (Signed)
Returned pt's call. I spoke to Dr Luisa Hart and he advised the pt to be seen by him tomorrow or Monday. I made an appt for pt on Monday 10/20/12 arrive at 1:15/1:30. The pt understands.

## 2012-10-20 ENCOUNTER — Ambulatory Visit (INDEPENDENT_AMBULATORY_CARE_PROVIDER_SITE_OTHER): Payer: BC Managed Care – PPO | Admitting: Surgery

## 2012-10-20 ENCOUNTER — Encounter (INDEPENDENT_AMBULATORY_CARE_PROVIDER_SITE_OTHER): Payer: Self-pay | Admitting: Surgery

## 2012-10-20 VITALS — BP 122/84 | HR 64 | Temp 98.2°F | Resp 16 | Ht 63.5 in | Wt 174.0 lb

## 2012-10-20 DIAGNOSIS — N644 Mastodynia: Secondary | ICD-10-CM | POA: Insufficient documentation

## 2012-10-20 MED ORDER — IBUPROFEN 200 MG PO TABS: 800.0000 mg | ORAL_TABLET | Freq: Four times a day (QID) | ORAL | Status: AC | PRN

## 2012-10-20 NOTE — Progress Notes (Signed)
Subjective:     Patient ID: Olivia Hall, female   DOB: 1961/06/10, 52 y.o.   MRN: [id number]  HPI Patient returns due to right breast pain. She was working last week and had a fall and fell on her right arm. She developed pain along her incision her right breast. She underwent a right breast lumpectomy for papilloma back in September of 2013. No nipple discharge or drainage from the incision. A separation at the incision site. The pain is better today than it was last week when she called.  Review of Systems  Respiratory: Negative.   Cardiovascular: Positive for chest pain.  Gastrointestinal: Negative.        Objective:   Physical Exam  Constitutional: She appears well-developed and well-nourished.  HENT:  Head: Normocephalic and atraumatic.  Eyes: EOM are normal. Pupils are equal, round, and reactive to light.  Pulmonary/Chest:    Skin: Skin is warm and dry.       Assessment:     Right breast pain. costocondritis    Plan:     Ibuprofen 800 mg tid for 1 week. Heat Call if no better

## 2012-10-20 NOTE — Patient Instructions (Signed)
advil 800 mg by mouth three times a day

## 2012-12-26 ENCOUNTER — Other Ambulatory Visit: Payer: Self-pay

## 2013-06-08 ENCOUNTER — Other Ambulatory Visit: Payer: Self-pay | Admitting: Obstetrics & Gynecology

## 2013-06-08 DIAGNOSIS — Z1231 Encounter for screening mammogram for malignant neoplasm of breast: Secondary | ICD-10-CM

## 2013-06-29 ENCOUNTER — Ambulatory Visit
Admission: RE | Admit: 2013-06-29 | Discharge: 2013-06-29 | Disposition: A | Discharge status: Home / Self Care | Payer: BC Managed Care – PPO | Source: Ambulatory Visit | Attending: Obstetrics & Gynecology | Admitting: Obstetrics & Gynecology

## 2013-06-29 DIAGNOSIS — Z1231 Encounter for screening mammogram for malignant neoplasm of breast: Secondary | ICD-10-CM

## 2013-12-29 ENCOUNTER — Other Ambulatory Visit: Payer: Self-pay

## 2014-05-26 ENCOUNTER — Other Ambulatory Visit: Payer: Self-pay

## 2014-05-26 DIAGNOSIS — Z1239 Encounter for other screening for malignant neoplasm of breast: Secondary | ICD-10-CM

## 2014-06-08 ENCOUNTER — Other Ambulatory Visit: Payer: Self-pay

## 2014-07-01 ENCOUNTER — Other Ambulatory Visit: Payer: Self-pay

## 2014-07-01 DIAGNOSIS — Z1231 Encounter for screening mammogram for malignant neoplasm of breast: Secondary | ICD-10-CM

## 2014-07-02 ENCOUNTER — Ambulatory Visit
Admission: RE | Admit: 2014-07-02 | Discharge: 2014-07-02 | Disposition: A | Discharge status: Home / Self Care | Payer: BC Managed Care – PPO | Source: Ambulatory Visit

## 2014-07-02 DIAGNOSIS — Z1231 Encounter for screening mammogram for malignant neoplasm of breast: Secondary | ICD-10-CM

## 2014-07-06 ENCOUNTER — Other Ambulatory Visit: Payer: Self-pay | Admitting: Obstetrics & Gynecology

## 2014-07-06 DIAGNOSIS — R928 Other abnormal and inconclusive findings on diagnostic imaging of breast: Secondary | ICD-10-CM

## 2014-07-23 ENCOUNTER — Ambulatory Visit
Admission: RE | Admit: 2014-07-23 | Discharge: 2014-07-23 | Disposition: A | Discharge status: Home / Self Care | Payer: BC Managed Care – PPO | Source: Ambulatory Visit | Attending: Obstetrics & Gynecology | Admitting: Obstetrics & Gynecology

## 2014-07-23 DIAGNOSIS — R928 Other abnormal and inconclusive findings on diagnostic imaging of breast: Secondary | ICD-10-CM

## 2015-07-11 ENCOUNTER — Other Ambulatory Visit: Payer: Self-pay

## 2015-07-11 DIAGNOSIS — Z1231 Encounter for screening mammogram for malignant neoplasm of breast: Secondary | ICD-10-CM

## 2015-07-29 ENCOUNTER — Ambulatory Visit
Admission: RE | Admit: 2015-07-29 | Discharge: 2015-07-29 | Disposition: A | Discharge status: Home / Self Care | Payer: BLUE CROSS/BLUE SHIELD | Source: Ambulatory Visit

## 2015-07-29 DIAGNOSIS — Z1231 Encounter for screening mammogram for malignant neoplasm of breast: Secondary | ICD-10-CM

## 2015-11-12 DIAGNOSIS — M4150 Other secondary scoliosis, site unspecified: Secondary | ICD-10-CM | POA: Insufficient documentation

## 2016-06-26 ENCOUNTER — Other Ambulatory Visit: Payer: Self-pay | Admitting: Obstetrics & Gynecology

## 2016-06-26 DIAGNOSIS — Z1231 Encounter for screening mammogram for malignant neoplasm of breast: Secondary | ICD-10-CM

## 2016-07-30 ENCOUNTER — Ambulatory Visit
Admission: RE | Admit: 2016-07-30 | Discharge: 2016-07-30 | Disposition: A | Discharge status: Home / Self Care | Payer: BLUE CROSS/BLUE SHIELD | Source: Ambulatory Visit | Attending: Obstetrics & Gynecology | Admitting: Obstetrics & Gynecology

## 2016-07-30 DIAGNOSIS — Z1231 Encounter for screening mammogram for malignant neoplasm of breast: Secondary | ICD-10-CM

## 2017-06-21 ENCOUNTER — Ambulatory Visit: Payer: Self-pay | Admitting: Sports Medicine

## 2017-06-21 ENCOUNTER — Ambulatory Visit: Payer: BLUE CROSS/BLUE SHIELD | Admitting: Sports Medicine

## 2017-06-21 ENCOUNTER — Encounter: Payer: Self-pay | Admitting: Sports Medicine

## 2017-06-21 VITALS — BP 159/86 | HR 82 | Ht 63.5 in | Wt 210.0 lb

## 2017-06-21 DIAGNOSIS — M79674 Pain in right toe(s): Secondary | ICD-10-CM | POA: Diagnosis not present

## 2017-06-21 DIAGNOSIS — M2041 Other hammer toe(s) (acquired), right foot: Secondary | ICD-10-CM

## 2017-06-21 DIAGNOSIS — L84 Corns and callosities: Secondary | ICD-10-CM | POA: Diagnosis not present

## 2017-06-21 NOTE — Patient Instructions (Signed)
Hammer Toe Hammer toe is a change in the shape (a deformity) of your second, third, or fourth toe. The deformity causes the middle joint of your toe to stay bent. This causes pain, especially when you are wearing shoes. Hammer toe starts gradually. At first, the toe can be straightened. Gradually over time, the deformity becomes stiff and permanent. Early treatments to keep the toe straight may relieve pain. As the deformity becomes stiff and permanent, surgery may be needed to straighten the toe. What are the causes? Hammer toe is caused by abnormal bending of the toe joint that is closest to your foot. It happens gradually over time. This pulls on the muscles and connections (tendons) of the toe joint, making them weak and stiff. It is often related to wearing shoes that are too short or narrow and do not let your toes straighten. What increases the risk? You may be at greater risk for hammer toe if you:  Are female.  Are older.  Wear shoes that are too small.  Wear high-heeled shoes that pinch your toes.  Are a ballet dancer.  Have a second toe that is longer than your big toe (first toe).  Injure your foot or toe.  Have arthritis.  Have a family history of hammer toe.  Have a nerve or muscle disorder.  What are the signs or symptoms? The main symptoms of this condition are pain and deformity of the toe. The pain is worse when wearing shoes, walking, or running. Other symptoms may include:  Corns or calluses over the bent part of the toe or between the toes.  Redness and a burning feeling on the toe.  An open sore that forms on the top of the toe.  Not being able to straighten the toe.  How is this diagnosed? This condition is diagnosed based on your symptoms and a physical exam. During the exam, your health care provider will try to straighten your toe to see how stiff the deformity is. You may also have tests, such as:  A blood test to check for rheumatoid  arthritis.  An X-ray to show how severe the deformity is.  How is this treated? Treatment for this condition will depend on how stiff the deformity is. Surgery is often needed. However, sometimes a hammer toe can be straightened without surgery. Treatments that do not involve surgery include:  Taping the toe into a straightened position.  Using pads and cushions to protect the toe (orthotics).  Wearing shoes that provide enough room for the toes.  Doing toe-stretching exercises at home.  Taking an NSAID to reduce pain and swelling.  If these treatments do not help or the toe cannot be straightened, surgery is the next option. The most common surgeries used to straighten a hammer toe include:  Arthroplasty. In this procedure, part of the joint is removed, and that allows the toe to straighten.  Fusion. In this procedure, cartilage between the two bones of the joint is taken out and the bones are fused together into one longer bone.  Implantation. In this procedure, part of the bone is removed and replaced with an implant to let the toe move again.  Flexor tendon transfer. In this procedure, the tendons that curl the toes down (flexor tendons) are repositioned.  Follow these instructions at home:  Take over-the-counter and prescription medicines only as told by your health care provider.  Do toe straightening and stretching exercises as told by your health care provider.  Keep all   follow-up visits as told by your health care provider. This is important. How is this prevented?  Wear shoes that give your toes enough room and do not cause pain.  Do not wear high-heeled shoes. Contact a health care provider if:  Your pain gets worse.  Your toe becomes red or swollen.  You develop an open sore on your toe. This information is not intended to replace advice given to you by your health care provider. Make sure you discuss any questions you have with your health care  provider. Document Released: 07/27/2000 Document Revised: 02/17/2016 Document Reviewed: 11/23/2015 Elsevier Interactive Patient Education  2018 [employer]  

## 2017-06-21 NOTE — Progress Notes (Signed)
   Subjective:    Patient ID: Olivia Hall, female    DOB: [birth year], 56 y.o.   MRN: [id number]  HPI  Olivia Hall is a 56 y.o. female patient who presents to office for evaluation of Right fifth toe pain secondary to callus skin. Patient complains of pain at the lesion present right fifth toe states that she has tried a corn pad which has helped states that she thinks this may have started after wearing a bad pair of shoes.  Patient admits a history of having to have left fifth toe hammertoe surgery because of a similar problem in the past and wants to have it checked to make sure that she was doing everything that she can do to prevent it from becoming a issue where she may require surgery. Patient denies any other pedal complaints.   Patient Active Problem List   Diagnosis Date Noted  . Breast pain 10/20/2012    Review of Systems  Neurological: Positive for weakness.  All other systems reviewed and are negative.   Current Outpatient Medications:  .  acetaminophen (TYLENOL) 325 MG tablet, Take 650 mg by mouth every 6 (six) hours as needed., Disp: , Rfl:  .  calcium carbonate (OS-CAL) 600 MG TABS, Take 600 mg by mouth 2 (two) times daily with a meal., Disp: , Rfl:  .  gabapentin (NEURONTIN) 300 MG capsule, Take 300 mg 3 (three) times daily by mouth., Disp: , Rfl:  .  meloxicam (MOBIC) 7.5 MG tablet, Take 7.5 mg daily by mouth., Disp: , Rfl:  .  Multiple Vitamin (MULTIVITAMIN) tablet, Take 1 tablet by mouth daily., Disp: , Rfl:  .  pregabalin (LYRICA) 100 MG capsule, Take 100 mg by mouth 2 (two) times daily. Pt does not remember correct dosage, Disp: , Rfl:    No Known Allergies     Objective:   Physical Exam Objective:  General: Alert and oriented x3 in no acute distress  Dermatology: Very minimal keratotic lesion present right fifth toe dorsal aspect with skin lines transversing the lesion, pain is present with direct pressure to the lesion with a very minimal central  nucleated core noted, no webspace macerations, no ecchymosis bilateral, all nails x 10 are well manicured.  Vascular: Dorsalis Pedis and Posterior Tibial pedal pulses 2/4, Capillary Fill Time 3 seconds, + pedal hair growth bilateral, no edema bilateral lower extremities, Temperature gradient within normal limits.  Neurology: Johney Maine sensation intact via light touch bilateral.  Musculoskeletal: Mild tenderness with palpation at the keratotic lesion site on Right, Muscular strength 5/5 in all groups without pain or limitation on range of motion.  Bunion and hammertoe deformity.      Assessment & Plan:   Problem List Items Addressed This Visit    None    Visit Diagnoses    Corn of toe    -  Primary   Hammer toe of right foot          -Complete examination performed -Discussed treatment options for painful hammertoe with corn -There was very minimal skin to trim at today's visit, no trim of corn was performed -Dispense hammertoe padding -Encouraged daily skin emollients -Encouraged use of pumice stone -Advised good supportive shoes and inserts -Patient to return to office as needed or sooner if condition worsens.  Advised patient if fails to get better likely will require surgery to correct hammertoe.  Landis Martins, DPM

## 2017-06-28 ENCOUNTER — Other Ambulatory Visit: Payer: Self-pay | Admitting: Obstetrics & Gynecology

## 2017-06-28 DIAGNOSIS — Z1231 Encounter for screening mammogram for malignant neoplasm of breast: Secondary | ICD-10-CM

## 2017-07-11 DIAGNOSIS — M542 Cervicalgia: Secondary | ICD-10-CM | POA: Diagnosis not present

## 2017-07-11 DIAGNOSIS — M9902 Segmental and somatic dysfunction of thoracic region: Secondary | ICD-10-CM | POA: Diagnosis not present

## 2017-07-11 DIAGNOSIS — M9901 Segmental and somatic dysfunction of cervical region: Secondary | ICD-10-CM | POA: Diagnosis not present

## 2017-07-11 DIAGNOSIS — M4712 Other spondylosis with myelopathy, cervical region: Secondary | ICD-10-CM | POA: Diagnosis not present

## 2017-08-01 ENCOUNTER — Ambulatory Visit
Admission: RE | Admit: 2017-08-01 | Discharge: 2017-08-01 | Disposition: A | Discharge status: Home / Self Care | Payer: BLUE CROSS/BLUE SHIELD | Source: Ambulatory Visit | Attending: Obstetrics & Gynecology | Admitting: Obstetrics & Gynecology

## 2017-08-01 DIAGNOSIS — Z1231 Encounter for screening mammogram for malignant neoplasm of breast: Secondary | ICD-10-CM

## 2017-09-12 DIAGNOSIS — M4712 Other spondylosis with myelopathy, cervical region: Secondary | ICD-10-CM | POA: Diagnosis not present

## 2017-09-12 DIAGNOSIS — M542 Cervicalgia: Secondary | ICD-10-CM | POA: Diagnosis not present

## 2017-09-12 DIAGNOSIS — M9901 Segmental and somatic dysfunction of cervical region: Secondary | ICD-10-CM | POA: Diagnosis not present

## 2017-09-12 DIAGNOSIS — M9902 Segmental and somatic dysfunction of thoracic region: Secondary | ICD-10-CM | POA: Diagnosis not present

## 2017-09-24 DIAGNOSIS — M4712 Other spondylosis with myelopathy, cervical region: Secondary | ICD-10-CM | POA: Diagnosis not present

## 2017-09-24 DIAGNOSIS — M9901 Segmental and somatic dysfunction of cervical region: Secondary | ICD-10-CM | POA: Diagnosis not present

## 2017-09-24 DIAGNOSIS — M9902 Segmental and somatic dysfunction of thoracic region: Secondary | ICD-10-CM | POA: Diagnosis not present

## 2017-09-24 DIAGNOSIS — M542 Cervicalgia: Secondary | ICD-10-CM | POA: Diagnosis not present

## 2017-10-03 DIAGNOSIS — J342 Deviated nasal septum: Secondary | ICD-10-CM | POA: Diagnosis not present

## 2017-10-03 DIAGNOSIS — R42 Dizziness and giddiness: Secondary | ICD-10-CM | POA: Diagnosis not present

## 2017-10-03 DIAGNOSIS — H8110 Benign paroxysmal vertigo, unspecified ear: Secondary | ICD-10-CM | POA: Diagnosis not present

## 2017-10-03 DIAGNOSIS — H9190 Unspecified hearing loss, unspecified ear: Secondary | ICD-10-CM | POA: Diagnosis not present

## 2017-10-10 DIAGNOSIS — M9902 Segmental and somatic dysfunction of thoracic region: Secondary | ICD-10-CM | POA: Diagnosis not present

## 2017-10-10 DIAGNOSIS — M542 Cervicalgia: Secondary | ICD-10-CM | POA: Diagnosis not present

## 2017-10-10 DIAGNOSIS — M9901 Segmental and somatic dysfunction of cervical region: Secondary | ICD-10-CM | POA: Diagnosis not present

## 2017-10-10 DIAGNOSIS — M4712 Other spondylosis with myelopathy, cervical region: Secondary | ICD-10-CM | POA: Diagnosis not present

## 2017-10-22 DIAGNOSIS — M9901 Segmental and somatic dysfunction of cervical region: Secondary | ICD-10-CM | POA: Diagnosis not present

## 2017-10-22 DIAGNOSIS — M542 Cervicalgia: Secondary | ICD-10-CM | POA: Diagnosis not present

## 2017-10-22 DIAGNOSIS — M4712 Other spondylosis with myelopathy, cervical region: Secondary | ICD-10-CM | POA: Diagnosis not present

## 2017-10-22 DIAGNOSIS — M9902 Segmental and somatic dysfunction of thoracic region: Secondary | ICD-10-CM | POA: Diagnosis not present

## 2017-11-07 DIAGNOSIS — M4712 Other spondylosis with myelopathy, cervical region: Secondary | ICD-10-CM | POA: Diagnosis not present

## 2017-11-07 DIAGNOSIS — M9901 Segmental and somatic dysfunction of cervical region: Secondary | ICD-10-CM | POA: Diagnosis not present

## 2017-11-07 DIAGNOSIS — M542 Cervicalgia: Secondary | ICD-10-CM | POA: Diagnosis not present

## 2017-11-07 DIAGNOSIS — M9902 Segmental and somatic dysfunction of thoracic region: Secondary | ICD-10-CM | POA: Diagnosis not present

## 2017-11-15 DIAGNOSIS — M5030 Other cervical disc degeneration, unspecified cervical region: Secondary | ICD-10-CM | POA: Diagnosis not present

## 2017-11-15 DIAGNOSIS — M4326 Fusion of spine, lumbar region: Secondary | ICD-10-CM | POA: Diagnosis not present

## 2017-11-15 DIAGNOSIS — Z981 Arthrodesis status: Secondary | ICD-10-CM | POA: Diagnosis not present

## 2017-11-18 DIAGNOSIS — R42 Dizziness and giddiness: Secondary | ICD-10-CM | POA: Diagnosis not present

## 2017-11-18 DIAGNOSIS — M48062 Spinal stenosis, lumbar region with neurogenic claudication: Secondary | ICD-10-CM | POA: Diagnosis not present

## 2017-11-18 DIAGNOSIS — I10 Essential (primary) hypertension: Secondary | ICD-10-CM | POA: Diagnosis not present

## 2017-11-19 DIAGNOSIS — Z4789 Encounter for other orthopedic aftercare: Secondary | ICD-10-CM | POA: Diagnosis not present

## 2017-11-19 DIAGNOSIS — M6281 Muscle weakness (generalized): Secondary | ICD-10-CM | POA: Diagnosis not present

## 2017-11-19 DIAGNOSIS — M545 Low back pain: Secondary | ICD-10-CM | POA: Diagnosis not present

## 2017-11-19 DIAGNOSIS — M4326 Fusion of spine, lumbar region: Secondary | ICD-10-CM | POA: Diagnosis not present

## 2017-11-21 DIAGNOSIS — M9901 Segmental and somatic dysfunction of cervical region: Secondary | ICD-10-CM | POA: Diagnosis not present

## 2017-11-21 DIAGNOSIS — M542 Cervicalgia: Secondary | ICD-10-CM | POA: Diagnosis not present

## 2017-11-21 DIAGNOSIS — M4712 Other spondylosis with myelopathy, cervical region: Secondary | ICD-10-CM | POA: Diagnosis not present

## 2017-11-21 DIAGNOSIS — M9902 Segmental and somatic dysfunction of thoracic region: Secondary | ICD-10-CM | POA: Diagnosis not present

## 2017-11-22 DIAGNOSIS — Z4789 Encounter for other orthopedic aftercare: Secondary | ICD-10-CM | POA: Diagnosis not present

## 2017-11-22 DIAGNOSIS — M4326 Fusion of spine, lumbar region: Secondary | ICD-10-CM | POA: Diagnosis not present

## 2017-11-22 DIAGNOSIS — M6281 Muscle weakness (generalized): Secondary | ICD-10-CM | POA: Diagnosis not present

## 2017-11-22 DIAGNOSIS — M545 Low back pain: Secondary | ICD-10-CM | POA: Diagnosis not present

## 2017-11-27 DIAGNOSIS — Z4789 Encounter for other orthopedic aftercare: Secondary | ICD-10-CM | POA: Diagnosis not present

## 2017-11-27 DIAGNOSIS — M545 Low back pain: Secondary | ICD-10-CM | POA: Diagnosis not present

## 2017-11-27 DIAGNOSIS — M4326 Fusion of spine, lumbar region: Secondary | ICD-10-CM | POA: Diagnosis not present

## 2017-11-27 DIAGNOSIS — M6281 Muscle weakness (generalized): Secondary | ICD-10-CM | POA: Diagnosis not present

## 2017-11-28 DIAGNOSIS — M545 Low back pain: Secondary | ICD-10-CM | POA: Diagnosis not present

## 2017-11-28 DIAGNOSIS — M6281 Muscle weakness (generalized): Secondary | ICD-10-CM | POA: Diagnosis not present

## 2017-11-28 DIAGNOSIS — M4326 Fusion of spine, lumbar region: Secondary | ICD-10-CM | POA: Diagnosis not present

## 2017-11-28 DIAGNOSIS — Z4789 Encounter for other orthopedic aftercare: Secondary | ICD-10-CM | POA: Diagnosis not present

## 2017-12-04 DIAGNOSIS — R531 Weakness: Secondary | ICD-10-CM | POA: Diagnosis not present

## 2017-12-04 DIAGNOSIS — Z736 Limitation of activities due to disability: Secondary | ICD-10-CM | POA: Diagnosis not present

## 2017-12-04 DIAGNOSIS — M48062 Spinal stenosis, lumbar region with neurogenic claudication: Secondary | ICD-10-CM | POA: Diagnosis not present

## 2017-12-04 DIAGNOSIS — M6281 Muscle weakness (generalized): Secondary | ICD-10-CM | POA: Diagnosis not present

## 2017-12-04 DIAGNOSIS — Z4789 Encounter for other orthopedic aftercare: Secondary | ICD-10-CM | POA: Diagnosis not present

## 2017-12-04 DIAGNOSIS — M545 Low back pain: Secondary | ICD-10-CM | POA: Diagnosis not present

## 2017-12-04 DIAGNOSIS — M4326 Fusion of spine, lumbar region: Secondary | ICD-10-CM | POA: Diagnosis not present

## 2017-12-05 DIAGNOSIS — M4712 Other spondylosis with myelopathy, cervical region: Secondary | ICD-10-CM | POA: Diagnosis not present

## 2017-12-05 DIAGNOSIS — M9902 Segmental and somatic dysfunction of thoracic region: Secondary | ICD-10-CM | POA: Diagnosis not present

## 2017-12-05 DIAGNOSIS — M542 Cervicalgia: Secondary | ICD-10-CM | POA: Diagnosis not present

## 2017-12-05 DIAGNOSIS — M9901 Segmental and somatic dysfunction of cervical region: Secondary | ICD-10-CM | POA: Diagnosis not present

## 2017-12-06 DIAGNOSIS — M545 Low back pain: Secondary | ICD-10-CM | POA: Diagnosis not present

## 2017-12-06 DIAGNOSIS — M6281 Muscle weakness (generalized): Secondary | ICD-10-CM | POA: Diagnosis not present

## 2017-12-06 DIAGNOSIS — M4326 Fusion of spine, lumbar region: Secondary | ICD-10-CM | POA: Diagnosis not present

## 2017-12-06 DIAGNOSIS — Z4789 Encounter for other orthopedic aftercare: Secondary | ICD-10-CM | POA: Diagnosis not present

## 2017-12-11 DIAGNOSIS — M545 Low back pain: Secondary | ICD-10-CM | POA: Diagnosis not present

## 2017-12-11 DIAGNOSIS — M6281 Muscle weakness (generalized): Secondary | ICD-10-CM | POA: Diagnosis not present

## 2017-12-11 DIAGNOSIS — Z4789 Encounter for other orthopedic aftercare: Secondary | ICD-10-CM | POA: Diagnosis not present

## 2017-12-11 DIAGNOSIS — M4326 Fusion of spine, lumbar region: Secondary | ICD-10-CM | POA: Diagnosis not present

## 2017-12-13 DIAGNOSIS — Z4789 Encounter for other orthopedic aftercare: Secondary | ICD-10-CM | POA: Diagnosis not present

## 2017-12-13 DIAGNOSIS — M6281 Muscle weakness (generalized): Secondary | ICD-10-CM | POA: Diagnosis not present

## 2017-12-13 DIAGNOSIS — M4326 Fusion of spine, lumbar region: Secondary | ICD-10-CM | POA: Diagnosis not present

## 2017-12-13 DIAGNOSIS — M545 Low back pain: Secondary | ICD-10-CM | POA: Diagnosis not present

## 2017-12-18 DIAGNOSIS — M4326 Fusion of spine, lumbar region: Secondary | ICD-10-CM | POA: Diagnosis not present

## 2017-12-18 DIAGNOSIS — M545 Low back pain: Secondary | ICD-10-CM | POA: Diagnosis not present

## 2017-12-18 DIAGNOSIS — Z4789 Encounter for other orthopedic aftercare: Secondary | ICD-10-CM | POA: Diagnosis not present

## 2017-12-18 DIAGNOSIS — M6281 Muscle weakness (generalized): Secondary | ICD-10-CM | POA: Diagnosis not present

## 2017-12-19 DIAGNOSIS — M9901 Segmental and somatic dysfunction of cervical region: Secondary | ICD-10-CM | POA: Diagnosis not present

## 2017-12-19 DIAGNOSIS — M542 Cervicalgia: Secondary | ICD-10-CM | POA: Diagnosis not present

## 2017-12-19 DIAGNOSIS — M9902 Segmental and somatic dysfunction of thoracic region: Secondary | ICD-10-CM | POA: Diagnosis not present

## 2017-12-19 DIAGNOSIS — M4712 Other spondylosis with myelopathy, cervical region: Secondary | ICD-10-CM | POA: Diagnosis not present

## 2017-12-20 DIAGNOSIS — M545 Low back pain: Secondary | ICD-10-CM | POA: Diagnosis not present

## 2017-12-20 DIAGNOSIS — Z4789 Encounter for other orthopedic aftercare: Secondary | ICD-10-CM | POA: Diagnosis not present

## 2017-12-20 DIAGNOSIS — M6281 Muscle weakness (generalized): Secondary | ICD-10-CM | POA: Diagnosis not present

## 2017-12-20 DIAGNOSIS — M4326 Fusion of spine, lumbar region: Secondary | ICD-10-CM | POA: Diagnosis not present

## 2017-12-25 DIAGNOSIS — M545 Low back pain: Secondary | ICD-10-CM | POA: Diagnosis not present

## 2017-12-25 DIAGNOSIS — Z4789 Encounter for other orthopedic aftercare: Secondary | ICD-10-CM | POA: Diagnosis not present

## 2017-12-25 DIAGNOSIS — M4326 Fusion of spine, lumbar region: Secondary | ICD-10-CM | POA: Diagnosis not present

## 2017-12-25 DIAGNOSIS — M6281 Muscle weakness (generalized): Secondary | ICD-10-CM | POA: Diagnosis not present

## 2017-12-27 DIAGNOSIS — M6281 Muscle weakness (generalized): Secondary | ICD-10-CM | POA: Diagnosis not present

## 2017-12-27 DIAGNOSIS — Z4789 Encounter for other orthopedic aftercare: Secondary | ICD-10-CM | POA: Diagnosis not present

## 2017-12-27 DIAGNOSIS — M545 Low back pain: Secondary | ICD-10-CM | POA: Diagnosis not present

## 2017-12-27 DIAGNOSIS — M4326 Fusion of spine, lumbar region: Secondary | ICD-10-CM | POA: Diagnosis not present

## 2018-01-01 DIAGNOSIS — M4326 Fusion of spine, lumbar region: Secondary | ICD-10-CM | POA: Diagnosis not present

## 2018-01-01 DIAGNOSIS — Z4789 Encounter for other orthopedic aftercare: Secondary | ICD-10-CM | POA: Diagnosis not present

## 2018-01-01 DIAGNOSIS — M6281 Muscle weakness (generalized): Secondary | ICD-10-CM | POA: Diagnosis not present

## 2018-01-01 DIAGNOSIS — M545 Low back pain: Secondary | ICD-10-CM | POA: Diagnosis not present

## 2018-01-02 DIAGNOSIS — M9902 Segmental and somatic dysfunction of thoracic region: Secondary | ICD-10-CM | POA: Diagnosis not present

## 2018-01-02 DIAGNOSIS — M4712 Other spondylosis with myelopathy, cervical region: Secondary | ICD-10-CM | POA: Diagnosis not present

## 2018-01-02 DIAGNOSIS — M542 Cervicalgia: Secondary | ICD-10-CM | POA: Diagnosis not present

## 2018-01-02 DIAGNOSIS — M9901 Segmental and somatic dysfunction of cervical region: Secondary | ICD-10-CM | POA: Diagnosis not present

## 2018-01-03 DIAGNOSIS — Z4789 Encounter for other orthopedic aftercare: Secondary | ICD-10-CM | POA: Diagnosis not present

## 2018-01-03 DIAGNOSIS — M4326 Fusion of spine, lumbar region: Secondary | ICD-10-CM | POA: Diagnosis not present

## 2018-01-03 DIAGNOSIS — M545 Low back pain: Secondary | ICD-10-CM | POA: Diagnosis not present

## 2018-01-03 DIAGNOSIS — M6281 Muscle weakness (generalized): Secondary | ICD-10-CM | POA: Diagnosis not present

## 2018-01-10 DIAGNOSIS — M545 Low back pain: Secondary | ICD-10-CM | POA: Diagnosis not present

## 2018-01-10 DIAGNOSIS — M4326 Fusion of spine, lumbar region: Secondary | ICD-10-CM | POA: Diagnosis not present

## 2018-01-10 DIAGNOSIS — M6281 Muscle weakness (generalized): Secondary | ICD-10-CM | POA: Diagnosis not present

## 2018-01-10 DIAGNOSIS — Z4789 Encounter for other orthopedic aftercare: Secondary | ICD-10-CM | POA: Diagnosis not present

## 2018-01-15 DIAGNOSIS — M4326 Fusion of spine, lumbar region: Secondary | ICD-10-CM | POA: Diagnosis not present

## 2018-01-15 DIAGNOSIS — Z01419 Encounter for gynecological examination (general) (routine) without abnormal findings: Secondary | ICD-10-CM | POA: Diagnosis not present

## 2018-01-15 DIAGNOSIS — Z6837 Body mass index (BMI) 37.0-37.9, adult: Secondary | ICD-10-CM | POA: Diagnosis not present

## 2018-01-15 DIAGNOSIS — M6281 Muscle weakness (generalized): Secondary | ICD-10-CM | POA: Diagnosis not present

## 2018-01-15 DIAGNOSIS — Z4789 Encounter for other orthopedic aftercare: Secondary | ICD-10-CM | POA: Diagnosis not present

## 2018-01-15 DIAGNOSIS — N841 Polyp of cervix uteri: Secondary | ICD-10-CM | POA: Diagnosis not present

## 2018-01-15 DIAGNOSIS — Z124 Encounter for screening for malignant neoplasm of cervix: Secondary | ICD-10-CM | POA: Diagnosis not present

## 2018-01-15 DIAGNOSIS — M545 Low back pain: Secondary | ICD-10-CM | POA: Diagnosis not present

## 2018-01-16 DIAGNOSIS — M9901 Segmental and somatic dysfunction of cervical region: Secondary | ICD-10-CM | POA: Diagnosis not present

## 2018-01-16 DIAGNOSIS — M9902 Segmental and somatic dysfunction of thoracic region: Secondary | ICD-10-CM | POA: Diagnosis not present

## 2018-01-16 DIAGNOSIS — M4712 Other spondylosis with myelopathy, cervical region: Secondary | ICD-10-CM | POA: Diagnosis not present

## 2018-01-16 DIAGNOSIS — M542 Cervicalgia: Secondary | ICD-10-CM | POA: Diagnosis not present

## 2018-01-17 DIAGNOSIS — Z4789 Encounter for other orthopedic aftercare: Secondary | ICD-10-CM | POA: Diagnosis not present

## 2018-01-17 DIAGNOSIS — M6281 Muscle weakness (generalized): Secondary | ICD-10-CM | POA: Diagnosis not present

## 2018-01-17 DIAGNOSIS — M4326 Fusion of spine, lumbar region: Secondary | ICD-10-CM | POA: Diagnosis not present

## 2018-01-17 DIAGNOSIS — M545 Low back pain: Secondary | ICD-10-CM | POA: Diagnosis not present

## 2018-01-22 DIAGNOSIS — M6281 Muscle weakness (generalized): Secondary | ICD-10-CM | POA: Diagnosis not present

## 2018-01-22 DIAGNOSIS — M4326 Fusion of spine, lumbar region: Secondary | ICD-10-CM | POA: Diagnosis not present

## 2018-01-22 DIAGNOSIS — M545 Low back pain: Secondary | ICD-10-CM | POA: Diagnosis not present

## 2018-01-22 DIAGNOSIS — Z4789 Encounter for other orthopedic aftercare: Secondary | ICD-10-CM | POA: Diagnosis not present

## 2018-01-24 DIAGNOSIS — M545 Low back pain: Secondary | ICD-10-CM | POA: Diagnosis not present

## 2018-01-24 DIAGNOSIS — Z4789 Encounter for other orthopedic aftercare: Secondary | ICD-10-CM | POA: Diagnosis not present

## 2018-01-24 DIAGNOSIS — M4326 Fusion of spine, lumbar region: Secondary | ICD-10-CM | POA: Diagnosis not present

## 2018-01-24 DIAGNOSIS — M6281 Muscle weakness (generalized): Secondary | ICD-10-CM | POA: Diagnosis not present

## 2018-01-29 DIAGNOSIS — Z6838 Body mass index (BMI) 38.0-38.9, adult: Secondary | ICD-10-CM | POA: Diagnosis not present

## 2018-01-29 DIAGNOSIS — Z4789 Encounter for other orthopedic aftercare: Secondary | ICD-10-CM | POA: Diagnosis not present

## 2018-01-29 DIAGNOSIS — M6281 Muscle weakness (generalized): Secondary | ICD-10-CM | POA: Diagnosis not present

## 2018-01-29 DIAGNOSIS — M545 Low back pain: Secondary | ICD-10-CM | POA: Diagnosis not present

## 2018-01-29 DIAGNOSIS — M4326 Fusion of spine, lumbar region: Secondary | ICD-10-CM | POA: Diagnosis not present

## 2018-01-29 DIAGNOSIS — M48062 Spinal stenosis, lumbar region with neurogenic claudication: Secondary | ICD-10-CM | POA: Diagnosis not present

## 2018-01-29 DIAGNOSIS — I10 Essential (primary) hypertension: Secondary | ICD-10-CM | POA: Diagnosis not present

## 2018-01-31 DIAGNOSIS — M545 Low back pain: Secondary | ICD-10-CM | POA: Diagnosis not present

## 2018-01-31 DIAGNOSIS — Z4789 Encounter for other orthopedic aftercare: Secondary | ICD-10-CM | POA: Diagnosis not present

## 2018-01-31 DIAGNOSIS — M4326 Fusion of spine, lumbar region: Secondary | ICD-10-CM | POA: Diagnosis not present

## 2018-01-31 DIAGNOSIS — M6281 Muscle weakness (generalized): Secondary | ICD-10-CM | POA: Diagnosis not present

## 2018-02-05 DIAGNOSIS — Z4789 Encounter for other orthopedic aftercare: Secondary | ICD-10-CM | POA: Diagnosis not present

## 2018-02-05 DIAGNOSIS — M4326 Fusion of spine, lumbar region: Secondary | ICD-10-CM | POA: Diagnosis not present

## 2018-02-05 DIAGNOSIS — M545 Low back pain: Secondary | ICD-10-CM | POA: Diagnosis not present

## 2018-02-05 DIAGNOSIS — M6281 Muscle weakness (generalized): Secondary | ICD-10-CM | POA: Diagnosis not present

## 2018-02-06 DIAGNOSIS — M9902 Segmental and somatic dysfunction of thoracic region: Secondary | ICD-10-CM | POA: Diagnosis not present

## 2018-02-06 DIAGNOSIS — M4712 Other spondylosis with myelopathy, cervical region: Secondary | ICD-10-CM | POA: Diagnosis not present

## 2018-02-06 DIAGNOSIS — M542 Cervicalgia: Secondary | ICD-10-CM | POA: Diagnosis not present

## 2018-02-06 DIAGNOSIS — M9901 Segmental and somatic dysfunction of cervical region: Secondary | ICD-10-CM | POA: Diagnosis not present

## 2018-02-07 DIAGNOSIS — Z4789 Encounter for other orthopedic aftercare: Secondary | ICD-10-CM | POA: Diagnosis not present

## 2018-02-07 DIAGNOSIS — M4326 Fusion of spine, lumbar region: Secondary | ICD-10-CM | POA: Diagnosis not present

## 2018-02-07 DIAGNOSIS — M545 Low back pain: Secondary | ICD-10-CM | POA: Diagnosis not present

## 2018-02-07 DIAGNOSIS — M6281 Muscle weakness (generalized): Secondary | ICD-10-CM | POA: Diagnosis not present

## 2018-02-19 DIAGNOSIS — M545 Low back pain: Secondary | ICD-10-CM | POA: Diagnosis not present

## 2018-02-19 DIAGNOSIS — M4326 Fusion of spine, lumbar region: Secondary | ICD-10-CM | POA: Diagnosis not present

## 2018-02-19 DIAGNOSIS — Z4789 Encounter for other orthopedic aftercare: Secondary | ICD-10-CM | POA: Diagnosis not present

## 2018-02-19 DIAGNOSIS — M6281 Muscle weakness (generalized): Secondary | ICD-10-CM | POA: Diagnosis not present

## 2018-02-20 DIAGNOSIS — M4712 Other spondylosis with myelopathy, cervical region: Secondary | ICD-10-CM | POA: Diagnosis not present

## 2018-02-20 DIAGNOSIS — M9902 Segmental and somatic dysfunction of thoracic region: Secondary | ICD-10-CM | POA: Diagnosis not present

## 2018-02-20 DIAGNOSIS — M9901 Segmental and somatic dysfunction of cervical region: Secondary | ICD-10-CM | POA: Diagnosis not present

## 2018-02-20 DIAGNOSIS — M542 Cervicalgia: Secondary | ICD-10-CM | POA: Diagnosis not present

## 2018-02-26 DIAGNOSIS — M545 Low back pain: Secondary | ICD-10-CM | POA: Diagnosis not present

## 2018-02-26 DIAGNOSIS — M6281 Muscle weakness (generalized): Secondary | ICD-10-CM | POA: Diagnosis not present

## 2018-02-26 DIAGNOSIS — M4326 Fusion of spine, lumbar region: Secondary | ICD-10-CM | POA: Diagnosis not present

## 2018-02-26 DIAGNOSIS — Z4789 Encounter for other orthopedic aftercare: Secondary | ICD-10-CM | POA: Diagnosis not present

## 2018-02-28 DIAGNOSIS — M545 Low back pain: Secondary | ICD-10-CM | POA: Diagnosis not present

## 2018-02-28 DIAGNOSIS — Z4789 Encounter for other orthopedic aftercare: Secondary | ICD-10-CM | POA: Diagnosis not present

## 2018-02-28 DIAGNOSIS — M6281 Muscle weakness (generalized): Secondary | ICD-10-CM | POA: Diagnosis not present

## 2018-02-28 DIAGNOSIS — M4326 Fusion of spine, lumbar region: Secondary | ICD-10-CM | POA: Diagnosis not present

## 2018-03-05 DIAGNOSIS — Z4789 Encounter for other orthopedic aftercare: Secondary | ICD-10-CM | POA: Diagnosis not present

## 2018-03-05 DIAGNOSIS — M6281 Muscle weakness (generalized): Secondary | ICD-10-CM | POA: Diagnosis not present

## 2018-03-05 DIAGNOSIS — M545 Low back pain: Secondary | ICD-10-CM | POA: Diagnosis not present

## 2018-03-05 DIAGNOSIS — M4326 Fusion of spine, lumbar region: Secondary | ICD-10-CM | POA: Diagnosis not present

## 2018-03-06 DIAGNOSIS — M9902 Segmental and somatic dysfunction of thoracic region: Secondary | ICD-10-CM | POA: Diagnosis not present

## 2018-03-06 DIAGNOSIS — M542 Cervicalgia: Secondary | ICD-10-CM | POA: Diagnosis not present

## 2018-03-06 DIAGNOSIS — M9901 Segmental and somatic dysfunction of cervical region: Secondary | ICD-10-CM | POA: Diagnosis not present

## 2018-03-06 DIAGNOSIS — M4712 Other spondylosis with myelopathy, cervical region: Secondary | ICD-10-CM | POA: Diagnosis not present

## 2018-03-07 DIAGNOSIS — M6281 Muscle weakness (generalized): Secondary | ICD-10-CM | POA: Diagnosis not present

## 2018-03-07 DIAGNOSIS — M4326 Fusion of spine, lumbar region: Secondary | ICD-10-CM | POA: Diagnosis not present

## 2018-03-07 DIAGNOSIS — M545 Low back pain: Secondary | ICD-10-CM | POA: Diagnosis not present

## 2018-03-07 DIAGNOSIS — Z4789 Encounter for other orthopedic aftercare: Secondary | ICD-10-CM | POA: Diagnosis not present

## 2018-03-14 DIAGNOSIS — M6281 Muscle weakness (generalized): Secondary | ICD-10-CM | POA: Diagnosis not present

## 2018-03-14 DIAGNOSIS — Z4789 Encounter for other orthopedic aftercare: Secondary | ICD-10-CM | POA: Diagnosis not present

## 2018-03-14 DIAGNOSIS — M545 Low back pain: Secondary | ICD-10-CM | POA: Diagnosis not present

## 2018-03-14 DIAGNOSIS — M4326 Fusion of spine, lumbar region: Secondary | ICD-10-CM | POA: Diagnosis not present

## 2018-03-20 DIAGNOSIS — M4712 Other spondylosis with myelopathy, cervical region: Secondary | ICD-10-CM | POA: Diagnosis not present

## 2018-03-20 DIAGNOSIS — M542 Cervicalgia: Secondary | ICD-10-CM | POA: Diagnosis not present

## 2018-03-20 DIAGNOSIS — M9901 Segmental and somatic dysfunction of cervical region: Secondary | ICD-10-CM | POA: Diagnosis not present

## 2018-03-20 DIAGNOSIS — M9902 Segmental and somatic dysfunction of thoracic region: Secondary | ICD-10-CM | POA: Diagnosis not present

## 2018-03-21 DIAGNOSIS — M545 Low back pain: Secondary | ICD-10-CM | POA: Diagnosis not present

## 2018-03-21 DIAGNOSIS — M6281 Muscle weakness (generalized): Secondary | ICD-10-CM | POA: Diagnosis not present

## 2018-03-21 DIAGNOSIS — M4326 Fusion of spine, lumbar region: Secondary | ICD-10-CM | POA: Diagnosis not present

## 2018-03-21 DIAGNOSIS — Z4789 Encounter for other orthopedic aftercare: Secondary | ICD-10-CM | POA: Diagnosis not present

## 2018-03-28 DIAGNOSIS — M4326 Fusion of spine, lumbar region: Secondary | ICD-10-CM | POA: Diagnosis not present

## 2018-03-28 DIAGNOSIS — M6281 Muscle weakness (generalized): Secondary | ICD-10-CM | POA: Diagnosis not present

## 2018-03-28 DIAGNOSIS — Z4789 Encounter for other orthopedic aftercare: Secondary | ICD-10-CM | POA: Diagnosis not present

## 2018-03-28 DIAGNOSIS — M545 Low back pain: Secondary | ICD-10-CM | POA: Diagnosis not present

## 2018-04-02 DIAGNOSIS — M545 Low back pain: Secondary | ICD-10-CM | POA: Diagnosis not present

## 2018-04-02 DIAGNOSIS — M4326 Fusion of spine, lumbar region: Secondary | ICD-10-CM | POA: Diagnosis not present

## 2018-04-02 DIAGNOSIS — M6281 Muscle weakness (generalized): Secondary | ICD-10-CM | POA: Diagnosis not present

## 2018-04-02 DIAGNOSIS — Z4789 Encounter for other orthopedic aftercare: Secondary | ICD-10-CM | POA: Diagnosis not present

## 2018-04-03 DIAGNOSIS — M4712 Other spondylosis with myelopathy, cervical region: Secondary | ICD-10-CM | POA: Diagnosis not present

## 2018-04-03 DIAGNOSIS — M542 Cervicalgia: Secondary | ICD-10-CM | POA: Diagnosis not present

## 2018-04-03 DIAGNOSIS — M9902 Segmental and somatic dysfunction of thoracic region: Secondary | ICD-10-CM | POA: Diagnosis not present

## 2018-04-03 DIAGNOSIS — M9901 Segmental and somatic dysfunction of cervical region: Secondary | ICD-10-CM | POA: Diagnosis not present

## 2018-04-04 DIAGNOSIS — M545 Low back pain: Secondary | ICD-10-CM | POA: Diagnosis not present

## 2018-04-04 DIAGNOSIS — M6281 Muscle weakness (generalized): Secondary | ICD-10-CM | POA: Diagnosis not present

## 2018-04-04 DIAGNOSIS — M4326 Fusion of spine, lumbar region: Secondary | ICD-10-CM | POA: Diagnosis not present

## 2018-04-04 DIAGNOSIS — Z4789 Encounter for other orthopedic aftercare: Secondary | ICD-10-CM | POA: Diagnosis not present

## 2018-04-09 DIAGNOSIS — M4326 Fusion of spine, lumbar region: Secondary | ICD-10-CM | POA: Diagnosis not present

## 2018-04-09 DIAGNOSIS — M545 Low back pain: Secondary | ICD-10-CM | POA: Diagnosis not present

## 2018-04-09 DIAGNOSIS — M6281 Muscle weakness (generalized): Secondary | ICD-10-CM | POA: Diagnosis not present

## 2018-04-09 DIAGNOSIS — Z4789 Encounter for other orthopedic aftercare: Secondary | ICD-10-CM | POA: Diagnosis not present

## 2018-04-11 DIAGNOSIS — M6281 Muscle weakness (generalized): Secondary | ICD-10-CM | POA: Diagnosis not present

## 2018-04-11 DIAGNOSIS — M545 Low back pain: Secondary | ICD-10-CM | POA: Diagnosis not present

## 2018-04-11 DIAGNOSIS — Z4789 Encounter for other orthopedic aftercare: Secondary | ICD-10-CM | POA: Diagnosis not present

## 2018-04-11 DIAGNOSIS — M4326 Fusion of spine, lumbar region: Secondary | ICD-10-CM | POA: Diagnosis not present

## 2018-04-16 DIAGNOSIS — M545 Low back pain: Secondary | ICD-10-CM | POA: Diagnosis not present

## 2018-04-16 DIAGNOSIS — M4326 Fusion of spine, lumbar region: Secondary | ICD-10-CM | POA: Diagnosis not present

## 2018-04-16 DIAGNOSIS — Z4789 Encounter for other orthopedic aftercare: Secondary | ICD-10-CM | POA: Diagnosis not present

## 2018-04-16 DIAGNOSIS — M6281 Muscle weakness (generalized): Secondary | ICD-10-CM | POA: Diagnosis not present

## 2018-04-17 DIAGNOSIS — M4712 Other spondylosis with myelopathy, cervical region: Secondary | ICD-10-CM | POA: Diagnosis not present

## 2018-04-17 DIAGNOSIS — M542 Cervicalgia: Secondary | ICD-10-CM | POA: Diagnosis not present

## 2018-04-17 DIAGNOSIS — M9902 Segmental and somatic dysfunction of thoracic region: Secondary | ICD-10-CM | POA: Diagnosis not present

## 2018-04-17 DIAGNOSIS — M9901 Segmental and somatic dysfunction of cervical region: Secondary | ICD-10-CM | POA: Diagnosis not present

## 2018-05-01 DIAGNOSIS — M9902 Segmental and somatic dysfunction of thoracic region: Secondary | ICD-10-CM | POA: Diagnosis not present

## 2018-05-01 DIAGNOSIS — M9901 Segmental and somatic dysfunction of cervical region: Secondary | ICD-10-CM | POA: Diagnosis not present

## 2018-05-01 DIAGNOSIS — M542 Cervicalgia: Secondary | ICD-10-CM | POA: Diagnosis not present

## 2018-05-01 DIAGNOSIS — M4712 Other spondylosis with myelopathy, cervical region: Secondary | ICD-10-CM | POA: Diagnosis not present

## 2018-05-13 DIAGNOSIS — M4125 Other idiopathic scoliosis, thoracolumbar region: Secondary | ICD-10-CM | POA: Diagnosis not present

## 2018-05-13 DIAGNOSIS — Z23 Encounter for immunization: Secondary | ICD-10-CM | POA: Diagnosis not present

## 2018-05-13 DIAGNOSIS — I10 Essential (primary) hypertension: Secondary | ICD-10-CM | POA: Diagnosis not present

## 2018-05-13 DIAGNOSIS — R05 Cough: Secondary | ICD-10-CM | POA: Diagnosis not present

## 2018-05-15 DIAGNOSIS — M1711 Unilateral primary osteoarthritis, right knee: Secondary | ICD-10-CM | POA: Diagnosis not present

## 2018-05-15 DIAGNOSIS — M25562 Pain in left knee: Secondary | ICD-10-CM | POA: Diagnosis not present

## 2018-05-15 DIAGNOSIS — R05 Cough: Secondary | ICD-10-CM | POA: Diagnosis not present

## 2018-05-22 DIAGNOSIS — M4712 Other spondylosis with myelopathy, cervical region: Secondary | ICD-10-CM | POA: Diagnosis not present

## 2018-05-22 DIAGNOSIS — M542 Cervicalgia: Secondary | ICD-10-CM | POA: Diagnosis not present

## 2018-05-22 DIAGNOSIS — M9902 Segmental and somatic dysfunction of thoracic region: Secondary | ICD-10-CM | POA: Diagnosis not present

## 2018-05-22 DIAGNOSIS — M9901 Segmental and somatic dysfunction of cervical region: Secondary | ICD-10-CM | POA: Diagnosis not present

## 2018-06-05 DIAGNOSIS — M542 Cervicalgia: Secondary | ICD-10-CM | POA: Diagnosis not present

## 2018-06-05 DIAGNOSIS — M9902 Segmental and somatic dysfunction of thoracic region: Secondary | ICD-10-CM | POA: Diagnosis not present

## 2018-06-05 DIAGNOSIS — M9901 Segmental and somatic dysfunction of cervical region: Secondary | ICD-10-CM | POA: Diagnosis not present

## 2018-06-05 DIAGNOSIS — M4712 Other spondylosis with myelopathy, cervical region: Secondary | ICD-10-CM | POA: Diagnosis not present

## 2018-06-19 DIAGNOSIS — M542 Cervicalgia: Secondary | ICD-10-CM | POA: Diagnosis not present

## 2018-06-19 DIAGNOSIS — M9901 Segmental and somatic dysfunction of cervical region: Secondary | ICD-10-CM | POA: Diagnosis not present

## 2018-06-19 DIAGNOSIS — M4712 Other spondylosis with myelopathy, cervical region: Secondary | ICD-10-CM | POA: Diagnosis not present

## 2018-06-19 DIAGNOSIS — M9902 Segmental and somatic dysfunction of thoracic region: Secondary | ICD-10-CM | POA: Diagnosis not present

## 2018-06-27 ENCOUNTER — Other Ambulatory Visit: Payer: Self-pay | Admitting: Obstetrics & Gynecology

## 2018-06-27 DIAGNOSIS — Z1231 Encounter for screening mammogram for malignant neoplasm of breast: Secondary | ICD-10-CM

## 2018-06-30 DIAGNOSIS — M542 Cervicalgia: Secondary | ICD-10-CM | POA: Diagnosis not present

## 2018-06-30 DIAGNOSIS — M9901 Segmental and somatic dysfunction of cervical region: Secondary | ICD-10-CM | POA: Diagnosis not present

## 2018-06-30 DIAGNOSIS — M9902 Segmental and somatic dysfunction of thoracic region: Secondary | ICD-10-CM | POA: Diagnosis not present

## 2018-06-30 DIAGNOSIS — M4712 Other spondylosis with myelopathy, cervical region: Secondary | ICD-10-CM | POA: Diagnosis not present

## 2018-07-14 DIAGNOSIS — M542 Cervicalgia: Secondary | ICD-10-CM | POA: Diagnosis not present

## 2018-07-14 DIAGNOSIS — M9902 Segmental and somatic dysfunction of thoracic region: Secondary | ICD-10-CM | POA: Diagnosis not present

## 2018-07-14 DIAGNOSIS — M4712 Other spondylosis with myelopathy, cervical region: Secondary | ICD-10-CM | POA: Diagnosis not present

## 2018-07-14 DIAGNOSIS — M9901 Segmental and somatic dysfunction of cervical region: Secondary | ICD-10-CM | POA: Diagnosis not present

## 2018-07-31 DIAGNOSIS — M9902 Segmental and somatic dysfunction of thoracic region: Secondary | ICD-10-CM | POA: Diagnosis not present

## 2018-07-31 DIAGNOSIS — M542 Cervicalgia: Secondary | ICD-10-CM | POA: Diagnosis not present

## 2018-07-31 DIAGNOSIS — M4712 Other spondylosis with myelopathy, cervical region: Secondary | ICD-10-CM | POA: Diagnosis not present

## 2018-07-31 DIAGNOSIS — M9901 Segmental and somatic dysfunction of cervical region: Secondary | ICD-10-CM | POA: Diagnosis not present

## 2018-08-15 ENCOUNTER — Ambulatory Visit: Payer: BLUE CROSS/BLUE SHIELD

## 2018-08-21 DIAGNOSIS — M542 Cervicalgia: Secondary | ICD-10-CM | POA: Diagnosis not present

## 2018-08-21 DIAGNOSIS — M9901 Segmental and somatic dysfunction of cervical region: Secondary | ICD-10-CM | POA: Diagnosis not present

## 2018-08-21 DIAGNOSIS — M9902 Segmental and somatic dysfunction of thoracic region: Secondary | ICD-10-CM | POA: Diagnosis not present

## 2018-08-21 DIAGNOSIS — M4712 Other spondylosis with myelopathy, cervical region: Secondary | ICD-10-CM | POA: Diagnosis not present

## 2018-09-04 DIAGNOSIS — M542 Cervicalgia: Secondary | ICD-10-CM | POA: Diagnosis not present

## 2018-09-04 DIAGNOSIS — M4712 Other spondylosis with myelopathy, cervical region: Secondary | ICD-10-CM | POA: Diagnosis not present

## 2018-09-04 DIAGNOSIS — M9901 Segmental and somatic dysfunction of cervical region: Secondary | ICD-10-CM | POA: Diagnosis not present

## 2018-09-04 DIAGNOSIS — M9902 Segmental and somatic dysfunction of thoracic region: Secondary | ICD-10-CM | POA: Diagnosis not present

## 2018-09-11 DIAGNOSIS — Z96652 Presence of left artificial knee joint: Secondary | ICD-10-CM | POA: Insufficient documentation

## 2018-09-11 DIAGNOSIS — M1712 Unilateral primary osteoarthritis, left knee: Secondary | ICD-10-CM | POA: Diagnosis not present

## 2018-09-11 DIAGNOSIS — G8929 Other chronic pain: Secondary | ICD-10-CM | POA: Diagnosis not present

## 2018-09-11 DIAGNOSIS — M1711 Unilateral primary osteoarthritis, right knee: Secondary | ICD-10-CM | POA: Diagnosis not present

## 2018-09-11 DIAGNOSIS — M25561 Pain in right knee: Secondary | ICD-10-CM | POA: Diagnosis not present

## 2018-09-12 ENCOUNTER — Ambulatory Visit
Admission: RE | Admit: 2018-09-12 | Discharge: 2018-09-12 | Disposition: A | Discharge status: Home / Self Care | Payer: BLUE CROSS/BLUE SHIELD | Source: Ambulatory Visit | Attending: Obstetrics & Gynecology | Admitting: Obstetrics & Gynecology

## 2018-09-12 DIAGNOSIS — Z1231 Encounter for screening mammogram for malignant neoplasm of breast: Secondary | ICD-10-CM

## 2018-11-12 DIAGNOSIS — Z20828 Contact with and (suspected) exposure to other viral communicable diseases: Secondary | ICD-10-CM | POA: Diagnosis not present

## 2018-11-12 DIAGNOSIS — H8112 Benign paroxysmal vertigo, left ear: Secondary | ICD-10-CM | POA: Diagnosis not present

## 2018-11-12 DIAGNOSIS — S72042D Displaced fracture of base of neck of left femur, subsequent encounter for closed fracture with routine healing: Secondary | ICD-10-CM | POA: Diagnosis not present

## 2018-11-12 DIAGNOSIS — Z01812 Encounter for preprocedural laboratory examination: Secondary | ICD-10-CM | POA: Diagnosis not present

## 2018-11-12 DIAGNOSIS — I10 Essential (primary) hypertension: Secondary | ICD-10-CM | POA: Diagnosis not present

## 2018-11-12 DIAGNOSIS — M4125 Other idiopathic scoliosis, thoracolumbar region: Secondary | ICD-10-CM | POA: Diagnosis not present

## 2019-01-23 DIAGNOSIS — M4325 Fusion of spine, thoracolumbar region: Secondary | ICD-10-CM | POA: Diagnosis not present

## 2019-01-23 DIAGNOSIS — M5135 Other intervertebral disc degeneration, thoracolumbar region: Secondary | ICD-10-CM | POA: Diagnosis not present

## 2019-01-23 DIAGNOSIS — M419 Scoliosis, unspecified: Secondary | ICD-10-CM | POA: Diagnosis not present

## 2019-01-23 DIAGNOSIS — G8929 Other chronic pain: Secondary | ICD-10-CM | POA: Diagnosis not present

## 2019-01-23 DIAGNOSIS — M1612 Unilateral primary osteoarthritis, left hip: Secondary | ICD-10-CM | POA: Diagnosis not present

## 2019-01-23 DIAGNOSIS — M25552 Pain in left hip: Secondary | ICD-10-CM | POA: Diagnosis not present

## 2019-03-10 DIAGNOSIS — M1612 Unilateral primary osteoarthritis, left hip: Secondary | ICD-10-CM | POA: Diagnosis not present

## 2019-03-10 DIAGNOSIS — Z8659 Personal history of other mental and behavioral disorders: Secondary | ICD-10-CM | POA: Diagnosis not present

## 2019-03-10 DIAGNOSIS — Z96652 Presence of left artificial knee joint: Secondary | ICD-10-CM | POA: Diagnosis not present

## 2019-03-10 DIAGNOSIS — M4150 Other secondary scoliosis, site unspecified: Secondary | ICD-10-CM | POA: Diagnosis not present

## 2019-03-10 DIAGNOSIS — R030 Elevated blood-pressure reading, without diagnosis of hypertension: Secondary | ICD-10-CM | POA: Diagnosis not present

## 2019-03-10 DIAGNOSIS — M160 Bilateral primary osteoarthritis of hip: Secondary | ICD-10-CM | POA: Diagnosis not present

## 2019-03-20 DIAGNOSIS — Z6841 Body Mass Index (BMI) 40.0 and over, adult: Secondary | ICD-10-CM | POA: Diagnosis not present

## 2019-03-20 DIAGNOSIS — M1612 Unilateral primary osteoarthritis, left hip: Secondary | ICD-10-CM | POA: Diagnosis not present

## 2019-05-01 DIAGNOSIS — M1612 Unilateral primary osteoarthritis, left hip: Secondary | ICD-10-CM | POA: Diagnosis not present

## 2019-05-18 DIAGNOSIS — Z8659 Personal history of other mental and behavioral disorders: Secondary | ICD-10-CM | POA: Diagnosis not present

## 2019-05-18 DIAGNOSIS — M1612 Unilateral primary osteoarthritis, left hip: Secondary | ICD-10-CM | POA: Diagnosis not present

## 2019-05-18 DIAGNOSIS — I10 Essential (primary) hypertension: Secondary | ICD-10-CM | POA: Diagnosis not present

## 2019-05-18 DIAGNOSIS — Z86718 Personal history of other venous thrombosis and embolism: Secondary | ICD-10-CM | POA: Diagnosis not present

## 2019-05-18 DIAGNOSIS — Z01818 Encounter for other preprocedural examination: Secondary | ICD-10-CM | POA: Diagnosis not present

## 2019-05-18 DIAGNOSIS — M4180 Other forms of scoliosis, site unspecified: Secondary | ICD-10-CM | POA: Diagnosis not present

## 2019-06-01 DIAGNOSIS — Z20828 Contact with and (suspected) exposure to other viral communicable diseases: Secondary | ICD-10-CM | POA: Diagnosis not present

## 2019-06-03 DIAGNOSIS — R42 Dizziness and giddiness: Secondary | ICD-10-CM | POA: Diagnosis not present

## 2019-06-03 DIAGNOSIS — R030 Elevated blood-pressure reading, without diagnosis of hypertension: Secondary | ICD-10-CM | POA: Diagnosis not present

## 2019-06-03 DIAGNOSIS — M1612 Unilateral primary osteoarthritis, left hip: Secondary | ICD-10-CM | POA: Diagnosis not present

## 2019-06-03 DIAGNOSIS — Z86718 Personal history of other venous thrombosis and embolism: Secondary | ICD-10-CM | POA: Diagnosis not present

## 2019-06-03 DIAGNOSIS — E669 Obesity, unspecified: Secondary | ICD-10-CM | POA: Diagnosis not present

## 2019-06-03 DIAGNOSIS — Z96652 Presence of left artificial knee joint: Secondary | ICD-10-CM | POA: Diagnosis not present

## 2019-06-03 DIAGNOSIS — M4180 Other forms of scoliosis, site unspecified: Secondary | ICD-10-CM | POA: Diagnosis not present

## 2019-06-03 DIAGNOSIS — Z471 Aftercare following joint replacement surgery: Secondary | ICD-10-CM | POA: Diagnosis not present

## 2019-06-03 DIAGNOSIS — Z79899 Other long term (current) drug therapy: Secondary | ICD-10-CM | POA: Diagnosis not present

## 2019-06-03 DIAGNOSIS — F329 Major depressive disorder, single episode, unspecified: Secondary | ICD-10-CM | POA: Diagnosis not present

## 2019-06-03 DIAGNOSIS — Z981 Arthrodesis status: Secondary | ICD-10-CM | POA: Diagnosis not present

## 2019-06-03 DIAGNOSIS — D649 Anemia, unspecified: Secondary | ICD-10-CM | POA: Diagnosis not present

## 2019-06-03 DIAGNOSIS — Z96642 Presence of left artificial hip joint: Secondary | ICD-10-CM | POA: Diagnosis not present

## 2019-06-03 DIAGNOSIS — Z6835 Body mass index (BMI) 35.0-35.9, adult: Secondary | ICD-10-CM | POA: Diagnosis not present

## 2019-06-03 DIAGNOSIS — I10 Essential (primary) hypertension: Secondary | ICD-10-CM | POA: Diagnosis not present

## 2019-06-22 DIAGNOSIS — Z96642 Presence of left artificial hip joint: Secondary | ICD-10-CM | POA: Insufficient documentation

## 2019-07-07 DIAGNOSIS — M25552 Pain in left hip: Secondary | ICD-10-CM | POA: Diagnosis not present

## 2019-07-07 DIAGNOSIS — M25652 Stiffness of left hip, not elsewhere classified: Secondary | ICD-10-CM | POA: Diagnosis not present

## 2019-07-13 DIAGNOSIS — M25652 Stiffness of left hip, not elsewhere classified: Secondary | ICD-10-CM | POA: Diagnosis not present

## 2019-07-13 DIAGNOSIS — M25552 Pain in left hip: Secondary | ICD-10-CM | POA: Diagnosis not present

## 2019-07-20 DIAGNOSIS — Z471 Aftercare following joint replacement surgery: Secondary | ICD-10-CM | POA: Diagnosis not present

## 2019-07-20 DIAGNOSIS — Z96642 Presence of left artificial hip joint: Secondary | ICD-10-CM | POA: Diagnosis not present

## 2019-07-21 DIAGNOSIS — M25552 Pain in left hip: Secondary | ICD-10-CM | POA: Diagnosis not present

## 2019-07-21 DIAGNOSIS — M25652 Stiffness of left hip, not elsewhere classified: Secondary | ICD-10-CM | POA: Diagnosis not present

## 2019-07-23 DIAGNOSIS — M25552 Pain in left hip: Secondary | ICD-10-CM | POA: Diagnosis not present

## 2019-07-23 DIAGNOSIS — M25652 Stiffness of left hip, not elsewhere classified: Secondary | ICD-10-CM | POA: Diagnosis not present

## 2019-07-27 DIAGNOSIS — M25552 Pain in left hip: Secondary | ICD-10-CM | POA: Diagnosis not present

## 2019-07-27 DIAGNOSIS — M25652 Stiffness of left hip, not elsewhere classified: Secondary | ICD-10-CM | POA: Diagnosis not present

## 2019-07-28 DIAGNOSIS — M25552 Pain in left hip: Secondary | ICD-10-CM | POA: Diagnosis not present

## 2019-07-28 DIAGNOSIS — M25652 Stiffness of left hip, not elsewhere classified: Secondary | ICD-10-CM | POA: Diagnosis not present

## 2019-08-04 DIAGNOSIS — M25552 Pain in left hip: Secondary | ICD-10-CM | POA: Diagnosis not present

## 2019-08-04 DIAGNOSIS — M25652 Stiffness of left hip, not elsewhere classified: Secondary | ICD-10-CM | POA: Diagnosis not present

## 2019-08-10 DIAGNOSIS — I10 Essential (primary) hypertension: Secondary | ICD-10-CM | POA: Diagnosis not present

## 2019-08-10 DIAGNOSIS — Z0000 Encounter for general adult medical examination without abnormal findings: Secondary | ICD-10-CM | POA: Diagnosis not present

## 2019-08-10 DIAGNOSIS — M4125 Other idiopathic scoliosis, thoracolumbar region: Secondary | ICD-10-CM | POA: Diagnosis not present

## 2019-08-10 DIAGNOSIS — E668 Other obesity: Secondary | ICD-10-CM | POA: Diagnosis not present

## 2019-08-11 DIAGNOSIS — M25552 Pain in left hip: Secondary | ICD-10-CM | POA: Diagnosis not present

## 2019-08-11 DIAGNOSIS — M25652 Stiffness of left hip, not elsewhere classified: Secondary | ICD-10-CM | POA: Diagnosis not present

## 2019-08-11 DIAGNOSIS — Z23 Encounter for immunization: Secondary | ICD-10-CM | POA: Diagnosis not present

## 2019-08-19 DIAGNOSIS — M25552 Pain in left hip: Secondary | ICD-10-CM | POA: Diagnosis not present

## 2019-08-19 DIAGNOSIS — M25652 Stiffness of left hip, not elsewhere classified: Secondary | ICD-10-CM | POA: Diagnosis not present

## 2019-08-20 DIAGNOSIS — M25652 Stiffness of left hip, not elsewhere classified: Secondary | ICD-10-CM | POA: Diagnosis not present

## 2019-08-20 DIAGNOSIS — M25552 Pain in left hip: Secondary | ICD-10-CM | POA: Diagnosis not present

## 2019-08-26 DIAGNOSIS — M25552 Pain in left hip: Secondary | ICD-10-CM | POA: Diagnosis not present

## 2019-08-26 DIAGNOSIS — M25652 Stiffness of left hip, not elsewhere classified: Secondary | ICD-10-CM | POA: Diagnosis not present

## 2019-08-28 DIAGNOSIS — M25652 Stiffness of left hip, not elsewhere classified: Secondary | ICD-10-CM | POA: Diagnosis not present

## 2019-08-28 DIAGNOSIS — M25552 Pain in left hip: Secondary | ICD-10-CM | POA: Diagnosis not present

## 2019-08-31 DIAGNOSIS — M25552 Pain in left hip: Secondary | ICD-10-CM | POA: Diagnosis not present

## 2019-08-31 DIAGNOSIS — M25652 Stiffness of left hip, not elsewhere classified: Secondary | ICD-10-CM | POA: Diagnosis not present

## 2019-09-01 DIAGNOSIS — M25652 Stiffness of left hip, not elsewhere classified: Secondary | ICD-10-CM | POA: Diagnosis not present

## 2019-09-01 DIAGNOSIS — M25552 Pain in left hip: Secondary | ICD-10-CM | POA: Diagnosis not present

## 2019-09-11 ENCOUNTER — Other Ambulatory Visit: Payer: Self-pay | Admitting: Family Medicine

## 2019-09-11 DIAGNOSIS — Z1231 Encounter for screening mammogram for malignant neoplasm of breast: Secondary | ICD-10-CM

## 2019-10-20 ENCOUNTER — Ambulatory Visit: Payer: BLUE CROSS/BLUE SHIELD

## 2019-10-21 ENCOUNTER — Other Ambulatory Visit: Payer: Self-pay

## 2019-10-21 ENCOUNTER — Ambulatory Visit
Admission: RE | Admit: 2019-10-21 | Discharge: 2019-10-21 | Disposition: A | Discharge status: Home / Self Care | Payer: PPO | Source: Ambulatory Visit | Attending: Family Medicine | Admitting: Family Medicine

## 2019-10-21 DIAGNOSIS — Z1231 Encounter for screening mammogram for malignant neoplasm of breast: Secondary | ICD-10-CM

## 2019-10-28 ENCOUNTER — Other Ambulatory Visit: Payer: Self-pay | Admitting: Family Medicine

## 2019-12-31 DIAGNOSIS — M4712 Other spondylosis with myelopathy, cervical region: Secondary | ICD-10-CM | POA: Diagnosis not present

## 2019-12-31 DIAGNOSIS — M9902 Segmental and somatic dysfunction of thoracic region: Secondary | ICD-10-CM | POA: Diagnosis not present

## 2019-12-31 DIAGNOSIS — M9901 Segmental and somatic dysfunction of cervical region: Secondary | ICD-10-CM | POA: Diagnosis not present

## 2019-12-31 DIAGNOSIS — M542 Cervicalgia: Secondary | ICD-10-CM | POA: Diagnosis not present

## 2020-01-01 DIAGNOSIS — H524 Presbyopia: Secondary | ICD-10-CM | POA: Diagnosis not present

## 2020-01-13 ENCOUNTER — Other Ambulatory Visit: Payer: Self-pay | Admitting: Family Medicine

## 2020-01-21 DIAGNOSIS — M9902 Segmental and somatic dysfunction of thoracic region: Secondary | ICD-10-CM | POA: Diagnosis not present

## 2020-01-21 DIAGNOSIS — M542 Cervicalgia: Secondary | ICD-10-CM | POA: Diagnosis not present

## 2020-01-21 DIAGNOSIS — M9901 Segmental and somatic dysfunction of cervical region: Secondary | ICD-10-CM | POA: Diagnosis not present

## 2020-01-21 DIAGNOSIS — M4712 Other spondylosis with myelopathy, cervical region: Secondary | ICD-10-CM | POA: Diagnosis not present

## 2020-01-22 DIAGNOSIS — M5134 Other intervertebral disc degeneration, thoracic region: Secondary | ICD-10-CM | POA: Diagnosis not present

## 2020-01-22 DIAGNOSIS — M5136 Other intervertebral disc degeneration, lumbar region: Secondary | ICD-10-CM | POA: Diagnosis not present

## 2020-01-22 DIAGNOSIS — M1611 Unilateral primary osteoarthritis, right hip: Secondary | ICD-10-CM | POA: Diagnosis not present

## 2020-01-22 DIAGNOSIS — M47816 Spondylosis without myelopathy or radiculopathy, lumbar region: Secondary | ICD-10-CM | POA: Diagnosis not present

## 2020-01-22 DIAGNOSIS — M21061 Valgus deformity, not elsewhere classified, right knee: Secondary | ICD-10-CM | POA: Diagnosis not present

## 2020-01-22 DIAGNOSIS — M4325 Fusion of spine, thoracolumbar region: Secondary | ICD-10-CM | POA: Diagnosis not present

## 2020-01-22 DIAGNOSIS — Z981 Arthrodesis status: Secondary | ICD-10-CM | POA: Diagnosis not present

## 2020-01-26 DIAGNOSIS — Z6835 Body mass index (BMI) 35.0-35.9, adult: Secondary | ICD-10-CM | POA: Diagnosis not present

## 2020-01-26 DIAGNOSIS — Z01419 Encounter for gynecological examination (general) (routine) without abnormal findings: Secondary | ICD-10-CM | POA: Diagnosis not present

## 2020-01-26 DIAGNOSIS — Z124 Encounter for screening for malignant neoplasm of cervix: Secondary | ICD-10-CM | POA: Diagnosis not present

## 2020-02-01 LAB — HM PAP SMEAR: HM Pap smear: NEGATIVE

## 2020-02-18 DIAGNOSIS — M9901 Segmental and somatic dysfunction of cervical region: Secondary | ICD-10-CM | POA: Diagnosis not present

## 2020-02-18 DIAGNOSIS — M9902 Segmental and somatic dysfunction of thoracic region: Secondary | ICD-10-CM | POA: Diagnosis not present

## 2020-02-18 DIAGNOSIS — M4712 Other spondylosis with myelopathy, cervical region: Secondary | ICD-10-CM | POA: Diagnosis not present

## 2020-02-18 DIAGNOSIS — M542 Cervicalgia: Secondary | ICD-10-CM | POA: Diagnosis not present

## 2020-03-03 DIAGNOSIS — M4712 Other spondylosis with myelopathy, cervical region: Secondary | ICD-10-CM | POA: Diagnosis not present

## 2020-03-03 DIAGNOSIS — M542 Cervicalgia: Secondary | ICD-10-CM | POA: Diagnosis not present

## 2020-03-03 DIAGNOSIS — M9902 Segmental and somatic dysfunction of thoracic region: Secondary | ICD-10-CM | POA: Diagnosis not present

## 2020-03-03 DIAGNOSIS — M9901 Segmental and somatic dysfunction of cervical region: Secondary | ICD-10-CM | POA: Diagnosis not present

## 2020-03-22 DIAGNOSIS — M542 Cervicalgia: Secondary | ICD-10-CM | POA: Diagnosis not present

## 2020-03-22 DIAGNOSIS — M4712 Other spondylosis with myelopathy, cervical region: Secondary | ICD-10-CM | POA: Diagnosis not present

## 2020-03-22 DIAGNOSIS — M9902 Segmental and somatic dysfunction of thoracic region: Secondary | ICD-10-CM | POA: Diagnosis not present

## 2020-03-22 DIAGNOSIS — M9901 Segmental and somatic dysfunction of cervical region: Secondary | ICD-10-CM | POA: Diagnosis not present

## 2020-04-01 DIAGNOSIS — M25552 Pain in left hip: Secondary | ICD-10-CM | POA: Diagnosis not present

## 2020-04-01 DIAGNOSIS — M25511 Pain in right shoulder: Secondary | ICD-10-CM | POA: Diagnosis not present

## 2020-04-01 DIAGNOSIS — S79912A Unspecified injury of left hip, initial encounter: Secondary | ICD-10-CM | POA: Diagnosis not present

## 2020-04-01 DIAGNOSIS — S4991XA Unspecified injury of right shoulder and upper arm, initial encounter: Secondary | ICD-10-CM | POA: Diagnosis not present

## 2020-04-01 DIAGNOSIS — Z96659 Presence of unspecified artificial knee joint: Secondary | ICD-10-CM | POA: Diagnosis not present

## 2020-04-01 DIAGNOSIS — Z96642 Presence of left artificial hip joint: Secondary | ICD-10-CM | POA: Diagnosis not present

## 2020-04-01 DIAGNOSIS — M542 Cervicalgia: Secondary | ICD-10-CM | POA: Diagnosis not present

## 2020-04-07 DIAGNOSIS — M542 Cervicalgia: Secondary | ICD-10-CM | POA: Diagnosis not present

## 2020-04-07 DIAGNOSIS — M9901 Segmental and somatic dysfunction of cervical region: Secondary | ICD-10-CM | POA: Diagnosis not present

## 2020-04-07 DIAGNOSIS — M4712 Other spondylosis with myelopathy, cervical region: Secondary | ICD-10-CM | POA: Diagnosis not present

## 2020-04-07 DIAGNOSIS — M9902 Segmental and somatic dysfunction of thoracic region: Secondary | ICD-10-CM | POA: Diagnosis not present

## 2020-04-08 ENCOUNTER — Other Ambulatory Visit: Payer: Self-pay | Admitting: Family Medicine

## 2020-04-14 DIAGNOSIS — Z01818 Encounter for other preprocedural examination: Secondary | ICD-10-CM | POA: Diagnosis not present

## 2020-04-14 DIAGNOSIS — Z1211 Encounter for screening for malignant neoplasm of colon: Secondary | ICD-10-CM | POA: Diagnosis not present

## 2020-05-20 ENCOUNTER — Ambulatory Visit (INDEPENDENT_AMBULATORY_CARE_PROVIDER_SITE_OTHER): Payer: PPO

## 2020-05-20 DIAGNOSIS — Z01812 Encounter for preprocedural laboratory examination: Secondary | ICD-10-CM | POA: Diagnosis not present

## 2020-05-20 NOTE — Progress Notes (Signed)
Patient Name: ANDRES ESCANDON Date of Birth: 1961-01-22 MRN:  [id number]  ARRIONNA SERENA is a 59 y.o. yo female presenting for COVID-19 testing.  She is being tested from the vehicle.  DEVENEY BAYON is being tested due to an upcoming procedure.  Results will be faxed to Dr Misenheimer's office at [phone].  Erie Noe, LPN 02:11 AM

## 2020-05-20 NOTE — Addendum Note (Signed)
Addended by: Erie Noe on: 05/20/2020 11:25 AM   Modules accepted: Level of Service

## 2020-05-22 LAB — NOVEL CORONAVIRUS, NAA: SARS-CoV-2, NAA: NOT DETECTED

## 2020-05-22 LAB — SARS-COV-2, NAA 2 DAY TAT

## 2020-05-27 ENCOUNTER — Encounter: Payer: Self-pay | Admitting: Infectious Diseases

## 2020-05-27 DIAGNOSIS — K644 Residual hemorrhoidal skin tags: Secondary | ICD-10-CM | POA: Diagnosis not present

## 2020-05-27 DIAGNOSIS — I10 Essential (primary) hypertension: Secondary | ICD-10-CM | POA: Diagnosis not present

## 2020-05-27 DIAGNOSIS — Z1211 Encounter for screening for malignant neoplasm of colon: Secondary | ICD-10-CM | POA: Diagnosis not present

## 2020-05-27 LAB — HM COLONOSCOPY

## 2020-06-02 DIAGNOSIS — Z471 Aftercare following joint replacement surgery: Secondary | ICD-10-CM | POA: Diagnosis not present

## 2020-06-02 DIAGNOSIS — Z96642 Presence of left artificial hip joint: Secondary | ICD-10-CM | POA: Diagnosis not present

## 2020-07-16 ENCOUNTER — Other Ambulatory Visit: Payer: Self-pay | Admitting: Family Medicine

## 2020-07-17 ENCOUNTER — Other Ambulatory Visit: Payer: Self-pay | Admitting: Family Medicine

## 2020-07-17 NOTE — Telephone Encounter (Signed)
Due for app. kc

## 2020-07-18 ENCOUNTER — Other Ambulatory Visit: Payer: Self-pay | Admitting: Family Medicine

## 2020-08-15 ENCOUNTER — Other Ambulatory Visit: Payer: Self-pay | Admitting: Family Medicine

## 2020-09-20 ENCOUNTER — Other Ambulatory Visit: Payer: Self-pay | Admitting: Family Medicine

## 2020-09-20 DIAGNOSIS — Z1231 Encounter for screening mammogram for malignant neoplasm of breast: Secondary | ICD-10-CM

## 2020-09-29 ENCOUNTER — Other Ambulatory Visit: Payer: Self-pay | Admitting: Family Medicine

## 2020-11-09 ENCOUNTER — Ambulatory Visit: Payer: PPO

## 2020-11-16 ENCOUNTER — Ambulatory Visit
Admission: RE | Admit: 2020-11-16 | Discharge: 2020-11-16 | Disposition: A | Discharge status: Home / Self Care | Payer: PPO | Source: Ambulatory Visit | Attending: Family Medicine | Admitting: Family Medicine

## 2020-11-16 ENCOUNTER — Other Ambulatory Visit: Payer: Self-pay

## 2020-11-16 DIAGNOSIS — Z1231 Encounter for screening mammogram for malignant neoplasm of breast: Secondary | ICD-10-CM | POA: Diagnosis not present

## 2020-11-17 NOTE — Progress Notes (Signed)
Patient needs diagnostic mammogram and possible ultrasound lp

## 2020-11-18 ENCOUNTER — Other Ambulatory Visit: Payer: Self-pay | Admitting: Family Medicine

## 2020-11-18 DIAGNOSIS — R928 Other abnormal and inconclusive findings on diagnostic imaging of breast: Secondary | ICD-10-CM

## 2020-12-07 ENCOUNTER — Ambulatory Visit
Admission: RE | Admit: 2020-12-07 | Discharge: 2020-12-07 | Disposition: A | Discharge status: Home / Self Care | Payer: PPO | Source: Ambulatory Visit | Attending: Family Medicine | Admitting: Family Medicine

## 2020-12-07 ENCOUNTER — Other Ambulatory Visit: Payer: Self-pay

## 2020-12-07 ENCOUNTER — Other Ambulatory Visit: Payer: Self-pay | Admitting: Family Medicine

## 2020-12-07 DIAGNOSIS — R928 Other abnormal and inconclusive findings on diagnostic imaging of breast: Secondary | ICD-10-CM | POA: Diagnosis not present

## 2020-12-09 ENCOUNTER — Other Ambulatory Visit: Payer: Self-pay | Admitting: Family Medicine

## 2020-12-09 DIAGNOSIS — R928 Other abnormal and inconclusive findings on diagnostic imaging of breast: Secondary | ICD-10-CM

## 2020-12-13 ENCOUNTER — Other Ambulatory Visit: Payer: Self-pay

## 2020-12-13 ENCOUNTER — Ambulatory Visit
Admission: RE | Admit: 2020-12-13 | Discharge: 2020-12-13 | Disposition: A | Discharge status: Home / Self Care | Payer: PPO | Source: Ambulatory Visit | Attending: Family Medicine | Admitting: Family Medicine

## 2020-12-13 DIAGNOSIS — R928 Other abnormal and inconclusive findings on diagnostic imaging of breast: Secondary | ICD-10-CM

## 2020-12-13 DIAGNOSIS — N6011 Diffuse cystic mastopathy of right breast: Secondary | ICD-10-CM | POA: Diagnosis not present

## 2021-01-04 ENCOUNTER — Other Ambulatory Visit: Payer: Self-pay | Admitting: Family Medicine

## 2021-01-04 DIAGNOSIS — H2513 Age-related nuclear cataract, bilateral: Secondary | ICD-10-CM | POA: Diagnosis not present

## 2021-01-04 NOTE — Telephone Encounter (Signed)
Pt returned call. Pt states her medication is on automatic refill so at this moment she does not need this refill. She will make pharmacy aware when she needs another. Advised she contact pharmacy as she has two full bottles of medication at home. She does not always take 3 tablets daily of gabapentin.  Removed lyrica from list, she does not take this medication. She was switched to gabapentin.   Harrell Lark 01/04/21 3:31 PM

## 2021-01-04 NOTE — Telephone Encounter (Signed)
Attempted to call pt. No answer. Left detailed message on identifying vm box.   Royce Macadamia, [place] 01/04/21 3:15 PM

## 2021-01-05 ENCOUNTER — Other Ambulatory Visit: Payer: Self-pay

## 2021-01-12 ENCOUNTER — Ambulatory Visit: Payer: Self-pay | Admitting: Surgery

## 2021-01-12 DIAGNOSIS — N6489 Other specified disorders of breast: Secondary | ICD-10-CM

## 2021-01-12 NOTE — H&P (Signed)
Olivia Hall Appointment: 01/12/2021 3:00 PM Location: [facility] Patient #: [id number] DOB: [birth year] Married / Language: Cleophus Molt / Race: White Female  History of Present Illness (Sherronda Sweigert A. Tashya Alberty MD; 01/12/2021 5:05 PM) Patient words: Patient sent at the request the [facility] for evaluation of right breast mass noted on mammogram. Core biopsy was done which showed a radial scar. History of right breast lumpectomy in 2013 for atypical ductal hyperplasia. No history of breast pain, nipple discharge or breast mass    The patient returns after screening study for evaluation of possible RIGHT breast distortion.  EXAM: DIGITAL DIAGNOSTIC UNILATERAL RIGHT MAMMOGRAM WITH TOMOSYNTHESIS AND CAD; ULTRASOUND RIGHT BREAST LIMITED  TECHNIQUE: Right digital diagnostic mammography and breast tomosynthesis was performed. The images were evaluated with computer-aided detection.; Targeted ultrasound examination of the right breast was performed  COMPARISON: Previous exam(s).  ACR Breast Density Category c: The breast tissue is heterogeneously dense, which may obscure small masses.  FINDINGS: Additional 2-D and 3-D images are performed. These views confirm presence of distortion in the UPPER central portion of the RIGHT breast, estimated to measure 8 millimeters in diameter.  On physical exam, I palpate no abnormality in the UPPER central RIGHT breast.  Targeted ultrasound is performed, showing distortion not associated with discrete mass in the 12 o'clock location of the RIGHT breast 2 centimeters from the nipple. This distortion is best appreciated on real-time imaging.  Evaluation of the RIGHT axilla is negative for adenopathy.  IMPRESSION: Persistent distortion associated with ultrasound correlation in the 12 o'clock location of the RIGHT breast 2 centimeters from the nipple.  No RIGHT axillary adenopathy.  RECOMMENDATION: Recommend ultrasound-guided  core biopsy of the RIGHT breast.  I have discussed the findings and recommendations with the patient. If applicable, a reminder letter will be sent to the patient regarding the next appointment.  BI-RADS CATEGORY 4: Suspicious.   Electronically Signed By: Nolon Nations M.D. On: 12/07/2020 15:27               Diagnosis Breast, right, needle core biopsy, 12:00 - COMPLEX SCLEROSING LESION WITH FOCAL ATYPICAL DUCTAL HYPERPLASIA - SEE COMMENT Microscopic Comment Immunohistochemistry (calponin, SMM and p63) highlights the presence of myoepithelial cells, supporting the diagnosis of a complex sclerosing lesion. Dr. Saralyn Pilar reviewed the case and agrees with the above diagnosis. These results were called to [facility] on Dec 15, 2020.  The patient is a 60 year old female.   Past Surgical History Illene Regulus, CMA; 01/12/2021 2:47 PM) Breast Biopsy Right. multiple Foot Surgery Left. Hip Surgery Left. Knee Surgery Left. Spinal Surgery - Lower Back Spinal Surgery Midback Tonsillectomy  Diagnostic Studies History Illene Regulus, CMA; 01/12/2021 2:47 PM) Colonoscopy within last year Mammogram within last year Pap Smear 1-5 years ago  Allergies Illene Regulus, CMA; 01/12/2021 2:48 PM) No Known Drug Allergies [01/12/2021]:  Medication History Illene Regulus, CMA; 01/12/2021 2:48 PM) Gabapentin (300MG  Capsule, Oral) Active. Lisinopril (10MG  Tablet, Oral) Active. Medications Reconciled  Social History Illene Regulus, CMA; 01/12/2021 2:47 PM) Caffeine use Coffee. No alcohol use No drug use Tobacco use Never smoker.  Family History Illene Regulus, CMA; 01/12/2021 2:47 PM) Alcohol Abuse Father. Diabetes Mellitus Family Members In General. Thyroid problems Mother.  Pregnancy / Birth History Illene Regulus, CMA; 01/12/2021 2:47 PM) Age of menopause <44 Gravida 1 Maternal age 42-30 Para 1  Other Problems Illene Regulus, CMA; 01/12/2021 2:47 PM) Anxiety Disorder Arthritis Back Pain High blood pressure Transfusion history  Review of Systems Lars Mage Spillers CMA; 01/12/2021 2:47 PM) Skin Not Present- Change in Wart/Mole, Dryness, Hives, Jaundice, New Lesions, Non-Healing Wounds, Rash and Ulcer. HEENT Not Present- Earache, Hearing Loss, Hoarseness, Nose Bleed, Oral Ulcers, Ringing in the Ears, Seasonal Allergies, Sinus Pain, Sore Throat, Visual Disturbances, Wears glasses/contact lenses and Yellow Eyes. Gastrointestinal Not Present- Abdominal Pain, Bloating, Bloody Stool, Change in Bowel Habits, Chronic diarrhea, Constipation, Difficulty Swallowing, Excessive gas, Gets full quickly at meals, Hemorrhoids, Indigestion, Nausea, Rectal Pain and Vomiting. Neurological Not Present- Decreased Memory, Fainting, Headaches, Numbness, Seizures, Tingling, Tremor, Trouble walking and Weakness. Psychiatric Not Present- Anxiety, Bipolar, Change in Sleep Pattern, Depression, Fearful and Frequent crying. Endocrine Not Present- Cold Intolerance, Excessive Hunger, Hair Changes, Heat Intolerance, Hot flashes and New Diabetes. Hematology Not Present- Blood Thinners, Easy Bruising, Excessive bleeding, Gland problems, HIV and Persistent Infections.  Vitals (Alisha Spillers CMA; 01/12/2021 2:48 PM) 01/12/2021 2:48 PM Weight: 180 lb Height: 63.5in Body Surface Area: 1.86 m Body Mass Index: 31.39 kg/m  BP: 142/80(Sitting, Left Arm, Standard)        Physical Exam (Teddi Badalamenti A. Nijee Heatwole MD; 01/12/2021 5:06 PM)  General Mental Status-Alert. General Appearance-Consistent with stated age. Hydration-Well hydrated. Voice-Normal.  Head and Neck Head-normocephalic, atraumatic with no lesions or palpable masses. Trachea-midline. Thyroid Gland Characteristics - normal size and consistency.  Chest and Lung Exam Note: Lungs clear bilaterally.  Breast Breast - Left-Symmetric, Non Tender, No Biopsy  scars, no Dimpling - Left, No Inflammation, No Lumpectomy scars, No Mastectomy scars, No Peau d' Orange. Breast - Right-Symmetric, Non Tender, No Biopsy scars, no Dimpling - Right, No Inflammation, No Lumpectomy scars, No Mastectomy scars, No Peau d' Orange. Breast Lump-No Palpable Breast Mass.  Cardiovascular Note: Normal sinus rhythm  Neurologic Neurologic evaluation reveals -alert and oriented x 3 with no impairment of recent or remote memory. Mental Status-Normal.  Lymphatic Axillary -Note:No evidence of lymphadenopathy bilaterally.     Assessment & Plan (Randen Kauth A. Nalini Alcaraz MD; 01/12/2021 5:09 PM)  RADIAL SCAR OF RIGHT BREAST (N64.89) Impression: Discussed pros and cons of surgical excision versus observation. Discussed potential malignant risk of bleeding around anywhere from 2-3% up to 15%. She has opted for right breast C localized lumpectomy. Given her previous history of atypical ductal hyperplasia, she will be increased lifetime risk would be eligible for an magnetic resonance imaging as follow-up. Risk of lumpectomy include bleeding, infection, seroma, more surgery, use of seed/wire, wound care, cosmetic deformity and the need for other treatments, death , blood clots, death. Pt agrees to proceed.  Current Plans Pt Education - [facility] Breast Biopsy HCI: discussed with patient and provided information. Pt Education - [facility] - General recommendations

## 2021-01-16 ENCOUNTER — Other Ambulatory Visit: Payer: Self-pay | Admitting: Surgery

## 2021-01-16 DIAGNOSIS — N6489 Other specified disorders of breast: Secondary | ICD-10-CM

## 2021-02-03 DIAGNOSIS — M4325 Fusion of spine, thoracolumbar region: Secondary | ICD-10-CM | POA: Diagnosis not present

## 2021-02-03 DIAGNOSIS — M21061 Valgus deformity, not elsewhere classified, right knee: Secondary | ICD-10-CM | POA: Diagnosis not present

## 2021-02-03 DIAGNOSIS — Z981 Arthrodesis status: Secondary | ICD-10-CM | POA: Diagnosis not present

## 2021-02-03 DIAGNOSIS — M47812 Spondylosis without myelopathy or radiculopathy, cervical region: Secondary | ICD-10-CM | POA: Diagnosis not present

## 2021-02-10 HISTORY — PX: BREAST EXCISIONAL BIOPSY: SUR124

## 2021-02-14 ENCOUNTER — Other Ambulatory Visit: Payer: Self-pay

## 2021-02-14 ENCOUNTER — Encounter (HOSPITAL_BASED_OUTPATIENT_CLINIC_OR_DEPARTMENT_OTHER): Payer: Self-pay | Admitting: Surgery

## 2021-02-20 ENCOUNTER — Ambulatory Visit
Admission: RE | Admit: 2021-02-20 | Discharge: 2021-02-20 | Disposition: A | Discharge status: Home / Self Care | Payer: PPO | Source: Ambulatory Visit | Attending: Surgery | Admitting: Surgery

## 2021-02-20 ENCOUNTER — Encounter (HOSPITAL_BASED_OUTPATIENT_CLINIC_OR_DEPARTMENT_OTHER)
Admission: RE | Admit: 2021-02-20 | Discharge: 2021-02-20 | Disposition: A | Discharge status: Home / Self Care | Payer: PPO | Source: Ambulatory Visit | Attending: Surgery | Admitting: Surgery

## 2021-02-20 ENCOUNTER — Other Ambulatory Visit: Payer: Self-pay

## 2021-02-20 DIAGNOSIS — R928 Other abnormal and inconclusive findings on diagnostic imaging of breast: Secondary | ICD-10-CM | POA: Diagnosis not present

## 2021-02-20 DIAGNOSIS — N6489 Other specified disorders of breast: Secondary | ICD-10-CM | POA: Diagnosis not present

## 2021-02-20 DIAGNOSIS — N6091 Unspecified benign mammary dysplasia of right breast: Secondary | ICD-10-CM | POA: Diagnosis not present

## 2021-02-20 LAB — CBC WITH DIFFERENTIAL/PLATELET
Abs Immature Granulocytes: 0.01 10*3/uL (ref 0.00–0.07)
Basophils Absolute: 0 10*3/uL (ref 0.0–0.1)
Basophils Relative: 1 %
Eosinophils Absolute: 0.1 10*3/uL (ref 0.0–0.5)
Eosinophils Relative: 2 %
HCT: 41.3 % (ref 36.0–46.0)
Hemoglobin: 13.2 g/dL (ref 12.0–15.0)
Immature Granulocytes: 0 %
Lymphocytes Relative: 35 %
Lymphs Abs: 1.6 10*3/uL (ref 0.7–4.0)
MCH: 29.1 pg (ref 26.0–34.0)
MCHC: 32 g/dL (ref 30.0–36.0)
MCV: 91 fL (ref 80.0–100.0)
Monocytes Absolute: 0.5 10*3/uL (ref 0.1–1.0)
Monocytes Relative: 10 %
Neutro Abs: 2.4 10*3/uL (ref 1.7–7.7)
Neutrophils Relative %: 52 %
Platelets: 267 10*3/uL (ref 150–400)
RBC: 4.54 MIL/uL (ref 3.87–5.11)
RDW: 12.4 % (ref 11.5–15.5)
WBC: 4.6 10*3/uL (ref 4.0–10.5)
nRBC: 0 % (ref 0.0–0.2)

## 2021-02-20 LAB — COMPREHENSIVE METABOLIC PANEL
ALT: 19 U/L (ref 0–44)
AST: 20 U/L (ref 15–41)
Albumin: 3.7 g/dL (ref 3.5–5.0)
Alkaline Phosphatase: 99 U/L (ref 38–126)
Anion gap: 10 (ref 5–15)
BUN: 15 mg/dL (ref 8–23)
CO2: 29 mmol/L (ref 22–32)
Calcium: 9.3 mg/dL (ref 8.9–10.3)
Chloride: 100 mmol/L (ref 98–111)
Creatinine, Ser: 0.61 mg/dL (ref 0.44–1.00)
GFR, Estimated: >60 mL/min (ref >60)
Glucose, Bld: 102 mg/dL — ABNORMAL HIGH (ref 70–99)
Potassium: 3.8 mmol/L (ref 3.5–5.1)
Sodium: 139 mmol/L (ref 135–145)
Total Bilirubin: 0.8 mg/dL (ref 0.3–1.2)
Total Protein: 6.8 g/dL (ref 6.5–8.1)

## 2021-02-20 NOTE — Anesthesia Preprocedure Evaluation (Addendum)
Anesthesia Evaluation  Patient identified by MRN, date of birth, ID band Patient awake    Reviewed: Allergy & Precautions, NPO status , Patient's Chart, lab work & pertinent test results  Airway Mallampati: III  TM Distance: >3 FB Neck ROM: Full    Dental no notable dental hx. (+) Partial Upper, Dental Advisory Given   Pulmonary neg pulmonary ROS,    Pulmonary exam normal breath sounds clear to auscultation       Cardiovascular hypertension, Pt. on medications Normal cardiovascular exam Rhythm:Regular Rate:Normal     Neuro/Psych negative neurological ROS  negative psych ROS   GI/Hepatic negative GI ROS, Neg liver ROS,   Endo/Other  negative endocrine ROS  Renal/GU negative Renal ROS     Musculoskeletal  (+) Arthritis ,   Abdominal (+) + obese,   Peds  Hematology Lab Results      Component                Value               Date                      WBC                      4.6                 02/20/2021                HGB                      13.2                02/20/2021                HCT                      41.3                02/20/2021                MCV                      91.0                02/20/2021                PLT                      267                 02/20/2021              Anesthesia Other Findings   Reproductive/Obstetrics negative OB ROS                            Anesthesia Physical Anesthesia Plan  ASA: 2  Anesthesia Plan: General   Post-op Pain Management:    Induction: Intravenous  PONV Risk Score and Plan: 4 or greater and Treatment may vary due to age or medical condition, Midazolam, Dexamethasone and Ondansetron  Airway Management Planned: LMA  Additional Equipment: None  Intra-op Plan:   Post-operative Plan: Extubation in OR  Informed Consent:     Dental advisory given  Plan Discussed with: CRNA  Anesthesia Plan Comments: ( LMA GA)  Anesthesia Quick Evaluation  

## 2021-02-20 NOTE — Progress Notes (Signed)
Left message reminding pt to come in for lab work.  

## 2021-02-20 NOTE — Progress Notes (Signed)

## 2021-02-21 ENCOUNTER — Encounter (HOSPITAL_BASED_OUTPATIENT_CLINIC_OR_DEPARTMENT_OTHER): Payer: Self-pay | Admitting: Surgery

## 2021-02-21 ENCOUNTER — Encounter (HOSPITAL_BASED_OUTPATIENT_CLINIC_OR_DEPARTMENT_OTHER)
Admission: RE | Disposition: A | Discharge status: Home / Self Care | Payer: Self-pay | Source: Home / Self Care | Attending: Surgery

## 2021-02-21 ENCOUNTER — Ambulatory Visit
Admission: RE | Admit: 2021-02-21 | Discharge: 2021-02-21 | Disposition: A | Discharge status: Home / Self Care | Payer: PPO | Source: Ambulatory Visit | Attending: Surgery | Admitting: Surgery

## 2021-02-21 ENCOUNTER — Other Ambulatory Visit: Payer: Self-pay

## 2021-02-21 ENCOUNTER — Ambulatory Visit (HOSPITAL_BASED_OUTPATIENT_CLINIC_OR_DEPARTMENT_OTHER)
Admission: RE | Admit: 2021-02-21 | Discharge: 2021-02-21 | Disposition: A | Discharge status: Home / Self Care | Payer: PPO | Attending: Surgery | Admitting: Surgery

## 2021-02-21 ENCOUNTER — Ambulatory Visit (HOSPITAL_BASED_OUTPATIENT_CLINIC_OR_DEPARTMENT_OTHER): Payer: PPO | Admitting: Anesthesiology

## 2021-02-21 DIAGNOSIS — N6489 Other specified disorders of breast: Secondary | ICD-10-CM

## 2021-02-21 DIAGNOSIS — R928 Other abnormal and inconclusive findings on diagnostic imaging of breast: Secondary | ICD-10-CM | POA: Diagnosis not present

## 2021-02-21 DIAGNOSIS — I10 Essential (primary) hypertension: Secondary | ICD-10-CM | POA: Diagnosis not present

## 2021-02-21 DIAGNOSIS — N6091 Unspecified benign mammary dysplasia of right breast: Secondary | ICD-10-CM | POA: Insufficient documentation

## 2021-02-21 HISTORY — PX: BREAST LUMPECTOMY WITH RADIOACTIVE SEED LOCALIZATION: SHX6424

## 2021-02-21 HISTORY — DX: Essential (primary) hypertension: I10

## 2021-02-21 SURGERY — BREAST LUMPECTOMY WITH RADIOACTIVE SEED LOCALIZATION
Anesthesia: General | Site: Breast | Laterality: Right

## 2021-02-21 MED ORDER — DEXAMETHASONE SODIUM PHOSPHATE 4 MG/ML IJ SOLN
INTRAMUSCULAR | Status: DC | PRN
  Administered 2021-02-21: 5 mg via INTRAVENOUS

## 2021-02-21 MED ORDER — EPHEDRINE SULFATE 50 MG/ML IJ SOLN
INTRAMUSCULAR | Status: DC | PRN
  Administered 2021-02-21: 10 mg via INTRAVENOUS

## 2021-02-21 MED ORDER — VANCOMYCIN HCL 500 MG IV SOLR
INTRAVENOUS | Status: DC | PRN
  Administered 2021-02-21: 500 mg via TOPICAL

## 2021-02-21 MED ORDER — DEXAMETHASONE SODIUM PHOSPHATE 10 MG/ML IJ SOLN
INTRAMUSCULAR | Status: AC
  Filled 2021-02-21: qty 1

## 2021-02-21 MED ORDER — MIDAZOLAM HCL 2 MG/2ML IJ SOLN
INTRAMUSCULAR | Status: AC
  Filled 2021-02-21: qty 2

## 2021-02-21 MED ORDER — LIDOCAINE HCL (CARDIAC) PF 100 MG/5ML IV SOSY
PREFILLED_SYRINGE | INTRAVENOUS | Status: DC | PRN
  Administered 2021-02-21: 100 mg via INTRAVENOUS

## 2021-02-21 MED ORDER — MIDAZOLAM HCL 5 MG/5ML IJ SOLN
INTRAMUSCULAR | Status: DC | PRN
  Administered 2021-02-21: 2 mg via INTRAVENOUS

## 2021-02-21 MED ORDER — ACETAMINOPHEN 10 MG/ML IV SOLN: 1000.0000 mg | Freq: Once | INTRAVENOUS | Status: DC | PRN

## 2021-02-21 MED ORDER — CEFAZOLIN SODIUM-DEXTROSE 2-4 GM/100ML-% IV SOLN
INTRAVENOUS | Status: AC
  Filled 2021-02-21: qty 100

## 2021-02-21 MED ORDER — IBUPROFEN 800 MG PO TABS: 800.0000 mg | ORAL_TABLET | Freq: Three times a day (TID) | ORAL | 0 refills | Status: DC | PRN

## 2021-02-21 MED ORDER — ONDANSETRON HCL 4 MG/2ML IJ SOLN
INTRAMUSCULAR | Status: DC | PRN
  Administered 2021-02-21: 4 mg via INTRAVENOUS

## 2021-02-21 MED ORDER — AMISULPRIDE (ANTIEMETIC) 5 MG/2ML IV SOLN: 10.0000 mg | Freq: Once | INTRAVENOUS | Status: DC | PRN

## 2021-02-21 MED ORDER — LIDOCAINE HCL (PF) 2 % IJ SOLN
INTRAMUSCULAR | Status: AC
  Filled 2021-02-21: qty 5

## 2021-02-21 MED ORDER — BUPIVACAINE-EPINEPHRINE (PF) 0.25% -1:200000 IJ SOLN
INTRAMUSCULAR | Status: DC | PRN
  Administered 2021-02-21: 16 mL

## 2021-02-21 MED ORDER — CHLORHEXIDINE GLUCONATE CLOTH 2 % EX PADS: 6.0000 | MEDICATED_PAD | Freq: Once | CUTANEOUS | Status: DC

## 2021-02-21 MED ORDER — ONDANSETRON HCL 4 MG/2ML IJ SOLN
INTRAMUSCULAR | Status: AC
  Filled 2021-02-21: qty 2

## 2021-02-21 MED ORDER — EPHEDRINE 5 MG/ML INJ
INTRAVENOUS | Status: AC
  Filled 2021-02-21: qty 10

## 2021-02-21 MED ORDER — ONDANSETRON HCL 4 MG/2ML IJ SOLN: 4.0000 mg | Freq: Once | INTRAMUSCULAR | Status: DC | PRN

## 2021-02-21 MED ORDER — CEFAZOLIN SODIUM-DEXTROSE 2-4 GM/100ML-% IV SOLN
2.0000 g | INTRAVENOUS | Status: AC
  Administered 2021-02-21: 2 g via INTRAVENOUS

## 2021-02-21 MED ORDER — OXYCODONE HCL 5 MG/5ML PO SOLN: 5.0000 mg | Freq: Once | ORAL | Status: DC | PRN

## 2021-02-21 MED ORDER — FENTANYL CITRATE (PF) 100 MCG/2ML IJ SOLN
INTRAMUSCULAR | Status: DC | PRN
  Administered 2021-02-21: 50 ug via INTRAVENOUS

## 2021-02-21 MED ORDER — FENTANYL CITRATE (PF) 100 MCG/2ML IJ SOLN
INTRAMUSCULAR | Status: AC
  Filled 2021-02-21: qty 2

## 2021-02-21 MED ORDER — LACTATED RINGERS IV SOLN: INTRAVENOUS | Status: DC

## 2021-02-21 MED ORDER — PROPOFOL 10 MG/ML IV BOLUS
INTRAVENOUS | Status: DC | PRN
  Administered 2021-02-21: 200 mg via INTRAVENOUS

## 2021-02-21 MED ORDER — HYDROCODONE-ACETAMINOPHEN 5-325 MG PO TABS: 1.0000 | ORAL_TABLET | Freq: Four times a day (QID) | ORAL | 0 refills | Status: DC | PRN

## 2021-02-21 MED ORDER — OXYCODONE HCL 5 MG PO TABS: 5.0000 mg | ORAL_TABLET | Freq: Once | ORAL | Status: DC | PRN

## 2021-02-21 MED ORDER — HYDROMORPHONE HCL 1 MG/ML IJ SOLN: 0.2500 mg | INTRAMUSCULAR | Status: DC | PRN

## 2021-02-21 SURGICAL SUPPLY — 50 items
ADH SKN CLS APL DERMABOND .7 (GAUZE/BANDAGES/DRESSINGS) ×1
APL PRP STRL LF DISP 70% ISPRP (MISCELLANEOUS) ×1
APPLIER CLIP 9.375 MED OPEN (MISCELLANEOUS)
APR CLP MED 9.3 20 MLT OPN (MISCELLANEOUS)
BINDER BREAST XLRG (GAUZE/BANDAGES/DRESSINGS) ×1 IMPLANT
BINDER BREAST XXLRG (GAUZE/BANDAGES/DRESSINGS) IMPLANT
BLADE SURG 15 STRL LF DISP TIS (BLADE) ×1 IMPLANT
BLADE SURG 15 STRL SS (BLADE) ×2
CANISTER SUC SOCK COL 7IN (MISCELLANEOUS) IMPLANT
CANISTER SUCT 1200ML W/VALVE (MISCELLANEOUS) ×1 IMPLANT
CHLORAPREP W/TINT 26 (MISCELLANEOUS) ×2 IMPLANT
CLIP APPLIE 9.375 MED OPEN (MISCELLANEOUS) IMPLANT
COVER BACK TABLE 60X90IN (DRAPES) ×2 IMPLANT
COVER MAYO STAND STRL (DRAPES) ×2 IMPLANT
COVER PROBE W GEL 5X96 (DRAPES) ×2 IMPLANT
DECANTER SPIKE VIAL GLASS SM (MISCELLANEOUS) IMPLANT
DERMABOND ADVANCED (GAUZE/BANDAGES/DRESSINGS) ×1
DERMABOND ADVANCED .7 DNX12 (GAUZE/BANDAGES/DRESSINGS) ×1 IMPLANT
DRAPE LAPAROTOMY 100X72 PEDS (DRAPES) ×2 IMPLANT
DRAPE UTILITY XL STRL (DRAPES) ×2 IMPLANT
ELECT COATED BLADE 2.86 ST (ELECTRODE) ×2 IMPLANT
ELECT REM PT RETURN 9FT ADLT (ELECTROSURGICAL) ×2
ELECTRODE REM PT RTRN 9FT ADLT (ELECTROSURGICAL) ×1 IMPLANT
GLOVE SRG 8 PF TXTR STRL LF DI (GLOVE) ×1 IMPLANT
GLOVE SURG ENC MOIS LTX SZ6.5 (GLOVE) ×1 IMPLANT
GLOVE SURG LTX SZ8 (GLOVE) ×2 IMPLANT
GLOVE SURG UNDER POLY LF SZ7 (GLOVE) ×1 IMPLANT
GLOVE SURG UNDER POLY LF SZ8 (GLOVE) ×2
GOWN STRL REUS W/ TWL LRG LVL3 (GOWN DISPOSABLE) ×2 IMPLANT
GOWN STRL REUS W/ TWL XL LVL3 (GOWN DISPOSABLE) ×1 IMPLANT
GOWN STRL REUS W/TWL LRG LVL3 (GOWN DISPOSABLE) ×2
GOWN STRL REUS W/TWL XL LVL3 (GOWN DISPOSABLE) ×2
HEMOSTAT ARISTA ABSORB 3G PWDR (HEMOSTASIS) IMPLANT
HEMOSTAT SNOW SURGICEL 2X4 (HEMOSTASIS) IMPLANT
KIT MARKER MARGIN INK (KITS) ×2 IMPLANT
NDL HYPO 25X1 1.5 SAFETY (NEEDLE) ×1 IMPLANT
NEEDLE HYPO 25X1 1.5 SAFETY (NEEDLE) ×2 IMPLANT
NS IRRIG 1000ML POUR BTL (IV SOLUTION) ×2 IMPLANT
PACK BASIN DAY SURGERY FS (CUSTOM PROCEDURE TRAY) ×2 IMPLANT
PENCIL SMOKE EVACUATOR (MISCELLANEOUS) ×2 IMPLANT
SLEEVE SCD COMPRESS KNEE MED (STOCKING) ×2 IMPLANT
SPONGE T-LAP 4X18 ~~LOC~~+RFID (SPONGE) ×2 IMPLANT
SUT MNCRL AB 4-0 PS2 18 (SUTURE) ×2 IMPLANT
SUT SILK 2 0 SH (SUTURE) IMPLANT
SUT VICRYL 3-0 CR8 SH (SUTURE) ×2 IMPLANT
SYR CONTROL 10ML LL (SYRINGE) ×2 IMPLANT
TOWEL GREEN STERILE FF (TOWEL DISPOSABLE) ×2 IMPLANT
TRAY FAXITRON CT DISP (TRAY / TRAY PROCEDURE) ×2 IMPLANT
TUBE CONNECTING 20X1/4 (TUBING) ×1 IMPLANT
YANKAUER SUCT BULB TIP NO VENT (SUCTIONS) ×1 IMPLANT

## 2021-02-21 NOTE — H&P (Signed)
Olivia Hall  Location: [facility] Patient #: [id number] DOB: 02-24-1961 Married / Language: English / Race: White Female   History of Present Illness (Patient words: Patient sent at the request the [facility] [place] for evaluation of right breast mass noted on mammogram.  Core biopsy was done which showed a radial scar.  History of right breast lumpectomy in 2013 for atypical ductal hyperplasia.  No history of breast pain, nipple discharge or breast mass       The patient returns after screening study for evaluation of possible RIGHT breast distortion.   EXAM: DIGITAL DIAGNOSTIC UNILATERAL RIGHT MAMMOGRAM WITH TOMOSYNTHESIS AND CAD; ULTRASOUND RIGHT BREAST LIMITED   TECHNIQUE: Right digital diagnostic mammography and breast tomosynthesis was performed. The images were evaluated with computer-aided detection.; Targeted ultrasound examination of the right breast was performed   COMPARISON:  Previous exam(s).   ACR Breast Density Category c: The breast tissue is heterogeneously dense, which may obscure small masses.   FINDINGS: Additional 2-D and 3-D images are performed. These views confirm presence of distortion in the UPPER central portion of the RIGHT breast, estimated to measure 8 millimeters in diameter.   On physical exam, I palpate no abnormality in the UPPER central RIGHT breast.   Targeted ultrasound is performed, showing distortion not associated with discrete mass in the 12 o'clock location of the RIGHT breast 2 centimeters from the nipple. This distortion is best appreciated on real-time imaging.   Evaluation of the RIGHT axilla is negative for adenopathy.   IMPRESSION: Persistent distortion associated with ultrasound correlation in the 12 o'clock location of the RIGHT breast 2 centimeters from the nipple.   No RIGHT axillary adenopathy.   RECOMMENDATION: Recommend ultrasound-guided core biopsy of the RIGHT breast.   I have discussed  the findings and recommendations with the patient. If applicable, a reminder letter will be sent to the patient regarding the next appointment.   BI-RADS CATEGORY  4: Suspicious.     Electronically Signed  By: Nolon Nations M.D.  On: 12/07/2020 15:27                             Diagnosis Breast, right, needle core biopsy, 12:00 - COMPLEX SCLEROSING LESION WITH FOCAL ATYPICAL DUCTAL HYPERPLASIA - SEE COMMENT Microscopic Comment Immunohistochemistry (calponin, SMM and p63) highlights the presence of myoepithelial cells, supporting the diagnosis of a complex sclerosing lesion. Dr. Saralyn Pilar reviewed the case and agrees with the above diagnosis. These results were called to [facility] on Dec 15, 2020.   The patient is a 60 year old female.     Past Surgical History ( Breast Biopsy  Right. multiple Foot Surgery  Left. Hip Surgery  Left. Knee Surgery  Left. Spinal Surgery - Lower Back  Spinal Surgery Midback  Tonsillectomy    Diagnostic Studies History ( Colonoscopy  within last year Mammogram  within last year Pap Smear  1-5 years ago   Allergies  No Known Drug Allergies     Medication History  Gabapentin  (300MG  Capsule, Oral) Active. Lisinopril  (10MG  Tablet, Oral) Active. Medications Reconciled    Social History  Caffeine use  Coffee. No alcohol use  No drug use  Tobacco use  Never smoker.   Family History  Alcohol Abuse  Father. Diabetes Mellitus  Family Members In General. Thyroid problems  Mother.   Pregnancy / Birth History () Age of menopause  <44 Gravida  1  Maternal age  75-30 Para  1   Other Problems ( Anxiety Disorder  Arthritis  Back Pain  High blood pressure  Transfusion history          Review of Systems Skin Not Present- Change in Wart/Mole, Dryness, Hives, Jaundice, New Lesions, Non-Healing Wounds, Rash and Ulcer. HEENT Not Present- Earache, Hearing Loss, Hoarseness, Nose Bleed, Oral Ulcers, Ringing  in the Ears, Seasonal Allergies, Sinus Pain, Sore Throat, Visual Disturbances, Wears glasses/contact lenses and Yellow Eyes. Gastrointestinal Not Present- Abdominal Pain, Bloating, Bloody Stool, Change in Bowel Habits, Chronic diarrhea, Constipation, Difficulty Swallowing, Excessive gas, Gets full quickly at meals, Hemorrhoids, Indigestion, Nausea, Rectal Pain and Vomiting. Neurological Not Present- Decreased Memory, Fainting, Headaches, Numbness, Seizures, Tingling, Tremor, Trouble walking and Weakness. Psychiatric Not Present- Anxiety, Bipolar, Change in Sleep Pattern, Depression, Fearful and Frequent crying. Endocrine Not Present- Cold Intolerance, Excessive Hunger, Hair Changes, Heat Intolerance, Hot flashes and New Diabetes. Hematology Not Present- Blood Thinners, Easy Bruising, Excessive bleeding, Gland problems, HIV and Persistent Infections.   Vitals (01/12/2021 2:48 PM Weight: 180 lb   Height: 63.5 in  Body Surface Area: 1.86 m   Body Mass Index: 31.39 kg/m   BP: 142/80(Sitting, Left Arm, Standard)               Physical Exam (  General Mental Status - Alert. General Appearance - Consistent with stated age. Hydration - Well hydrated. Voice - Normal.   Head and Neck Head - normocephalic, atraumatic with no lesions or palpable masses. Trachea - midline. Thyroid Gland Characteristics - normal size and consistency.   Chest and Lung Exam Note:  Lungs clear bilaterally.   Breast Breast - Left - Symmetric, Non Tender, No Biopsy scars, no Dimpling - Left, No Inflammation, No Lumpectomy scars, No Mastectomy scars, No Peau d' Orange. Breast - Right - Symmetric, Non Tender, No Biopsy scars, no Dimpling - Right, No Inflammation, No Lumpectomy scars, No Mastectomy scars, No Peau d' Orange. Breast Lump - No Palpable Breast Mass.   Cardiovascular Note:  Normal sinus rhythm   Neurologic Neurologic evaluation reveals  - alert and oriented x 3 with no impairment of recent or remote  memory. Mental Status - Normal.   Lymphatic Axillary  - Note: No evidence of lymphadenopathy bilaterally.         Assessment & Plan    RADIAL SCAR OF RIGHT BREAST (N64.89) Impression: Discussed pros and cons of surgical excision versus observation. Discussed potential malignant risk of bleeding around anywhere from 2-3% up to 15%. She has opted for right breast C localized lumpectomy. Given her previous history of atypical ductal hyperplasia, she will be increased lifetime risk would be eligible for an magnetic resonance imaging as follow-up. Risk of lumpectomy include bleeding, infection, seroma, more surgery, use of seed/wire, wound care, cosmetic deformity and the need for other treatments, death , blood clots, death. Pt agrees to proceed.   Current Plans Pt Education - [facility] Breast Biopsy HCI: discussed with patient and provided information. Pt Education - [facility] - General recommendations

## 2021-02-21 NOTE — Anesthesia Postprocedure Evaluation (Signed)
Anesthesia Post Note  Patient: Olivia Hall  Procedure(s) Performed: RIGHT BREAST LUMPECTOMY WITH RADIOACTIVE SEED LOCALIZATION (Right: Breast)     Patient location during evaluation: PACU Anesthesia Type: General Level of consciousness: awake and alert Pain management: pain level controlled Vital Signs Assessment: post-procedure vital signs reviewed and stable Respiratory status: spontaneous breathing, nonlabored ventilation, respiratory function stable and patient connected to nasal cannula oxygen Cardiovascular status: blood pressure returned to baseline and stable Postop Assessment: no apparent nausea or vomiting Anesthetic complications: no   No notable events documented.  Last Vitals:  Vitals:   02/21/21 1121 02/21/21 1132  BP: 137/70 (!) 148/68  Pulse: 84 84  Resp: 19 18  Temp:  36.5 C  SpO2: 94% 95%    Last Pain:  Vitals:   02/21/21 1132  TempSrc:   PainSc: 0-No pain                 Barnet Glasgow

## 2021-02-21 NOTE — Op Note (Signed)
Preoperative diagnosis: Right breast atypical ductal hyperplasia  Postoperative diagnosis: Same  Procedure: Right breast seed localized lumpectomy  Surgeon: Erroll Luna, MD  Anesthesia: LMA with 0.25% Marcaine plain  EBL: 20 cc  Specimen: Right breast tissue with seed and clip verified by Faxitron and oriented with ink and sent to pathology  Drains: None  IV fluids: Per anesthesia record  Indications for procedure: The patient is a 60 year old female with a screen detected right breast mammographic abnormality.  Core biopsy proved atypical ductal hyperplasia.  We discussed lumpectomy given the potential upgrade risk of 20% of this being an occult malignancy.  We also discussed high risk management practices.  We discussed nonoperative management.  Pros and cons of surgery were discussed as well as nonoperative management and long-term expectations and she wished to proceed with right breast seed localized lumpectomy.The procedure has been discussed with the patient. Alternatives to surgery have been discussed with the patient.  Risks of surgery include bleeding,  Infection,  Seroma formation, death,  and the need for further surgery.   The patient understands and wishes to proceed.    Description of procedure: The patient was met in the holding area and questions were answered.  Right breast was marked as the correct site after verification of seed location with the neoprobe.  She was then taken back to the operating.  She is placed supine upon the OR table.  After induction of general anesthesia, right breast was prepped and draped in a sterile fashion timeout performed.  Proper patient, site and procedure were verified.  Neoprobe was then used to localize the seed right breast upper outer quadrant.  Curvilinear incision was made along the lateral border of the nipple areolar complex.  Dissection was carried down all tissue around the seed and clip were excised with a grossly negative  margin.  The Faxitron image revealed the seed and clip to be present.  Hemostasis achieved with cautery and vancomycin placed in the lumpectomy cavity.  Cavity was then infiltrated with local anesthetic.  It was then closed with a deep layer of 3-0 Vicryl and 4 Monocryl.  Dermabond applied.  Breast binder placed.  All counts were found to be correct.  The patient was awoke extubated taken to recovery in satisfactory condition.

## 2021-02-21 NOTE — Anesthesia Procedure Notes (Signed)
Procedure Name: LMA Insertion Date/Time: 02/21/2021 10:21 AM Performed by: Bufford Spikes, CRNA Pre-anesthesia Checklist: Patient identified, Emergency Drugs available, Suction available and Patient being monitored Patient Re-evaluated:Patient Re-evaluated prior to induction Oxygen Delivery Method: Circle system utilized Preoxygenation: Pre-oxygenation with 100% oxygen Induction Type: IV induction Ventilation: Mask ventilation without difficulty LMA: LMA inserted LMA Size: 4.0 Number of attempts: 1 Placement Confirmation: positive ETCO2 Tube secured with: Tape Dental Injury: Teeth and Oropharynx as per pre-operative assessment

## 2021-02-21 NOTE — Interval H&P Note (Signed)
History and Physical Interval Note:  02/21/2021 8:02 AM  Olivia Hall  has presented today for surgery, with the diagnosis of RIGHT BREAST RADIAL SCAR.  The various methods of treatment have been discussed with the patient and family. After consideration of risks, benefits and other options for treatment, the patient has consented to  Procedure(s): RIGHT BREAST LUMPECTOMY WITH RADIOACTIVE SEED LOCALIZATION (Right) as a surgical intervention.  The patient's history has been reviewed, patient examined, no change in status, stable for surgery.  I have reviewed the patient's chart and labs.  Questions were answered to the patient's satisfaction.     [place]

## 2021-02-21 NOTE — Discharge Instructions (Addendum)
[facility] Office Phone Number [phone]  BREAST BIOPSY/ PARTIAL MASTECTOMY: POST OP INSTRUCTIONS  Always review your discharge instruction sheet given to you by the facility where your surgery was performed.  IF YOU HAVE DISABILITY OR FAMILY LEAVE FORMS, YOU MUST BRING THEM TO THE OFFICE FOR PROCESSING.  DO NOT GIVE THEM TO YOUR DOCTOR.  A prescription for pain medication may be given to you upon discharge.  Take your pain medication as prescribed, if needed.  If narcotic pain medicine is not needed, then you may take acetaminophen (Tylenol) or ibuprofen (Advil) as needed. Take your usually prescribed medications unless otherwise directed If you need a refill on your pain medication, please contact your pharmacy.  They will contact our office to request authorization.  Prescriptions will not be filled after 5pm or on week-ends. You should eat very light the first 24 hours after surgery, such as soup, crackers, pudding, etc.  Resume your normal diet the day after surgery. Most patients will experience some swelling and bruising in the breast.  Ice packs and a good support bra will help.  Swelling and bruising can take several days to resolve.  It is common to experience some constipation if taking pain medication after surgery.  Increasing fluid intake and taking a stool softener will usually help or prevent this problem from occurring.  A mild laxative (Milk of Magnesia or Miralax) should be taken according to package directions if there are no bowel movements after 48 hours. Unless discharge instructions indicate otherwise, you may remove your bandages 24-48 hours after surgery, and you may shower at that time.  You may have steri-strips (small skin tapes) in place directly over the incision.  These strips should be left on the skin for 7-10 days.  If your surgeon used skin glue on the incision, you may shower in 24 hours.  The glue will flake off over the next 2-3 weeks.  Any  sutures or staples will be removed at the office during your follow-up visit. ACTIVITIES:  You may resume regular daily activities (gradually increasing) beginning the next day.  Wearing a good support bra or sports bra minimizes pain and swelling.  You may have sexual intercourse when it is comfortable. You may drive when you no longer are taking prescription pain medication, you can comfortably wear a seatbelt, and you can safely maneuver your car and apply brakes. RETURN TO WORK:  ______________________________________________________________________________________ You should see your doctor in the office for a follow-up appointment approximately two weeks after your surgery.  Your doctor's nurse will typically make your follow-up appointment when she calls you with your pathology report.  Expect your pathology report 2-3 business days after your surgery.  You may call to check if you do not hear from us after three days. OTHER INSTRUCTIONS: _______________________________________________________________________________________________ _____________________________________________________________________________________________________________________________________ _____________________________________________________________________________________________________________________________________ _____________________________________________________________________________________________________________________________________  WHEN TO CALL YOUR DOCTOR: Fever over 101.0 Nausea and/or vomiting. Extreme swelling or bruising. Continued bleeding from incision. Increased pain, redness, or drainage from the incision.  The clinic staff is available to answer your questions during regular business hours.  Please don't hesitate to call and ask to speak to one of the nurses for clinical concerns.  If you have a medical emergency, go to the nearest emergency room or call 911.  A surgeon from [facility] is always on call at the hospital.  For further questions, please visit [email or web address]    Post Anesthesia Home Care Instructions  Activity: Get plenty of rest for the remainder of   the day. A responsible individual must stay with you for 24 hours following the procedure.  For the next 24 hours, DO NOT: -Drive a car -Operate machinery -Drink alcoholic beverages -Take any medication unless instructed by your physician -Make any legal decisions or sign important papers.  Meals: Start with liquid foods such as gelatin or soup. Progress to regular foods as tolerated. Avoid greasy, spicy, heavy foods. If nausea and/or vomiting occur, drink only clear liquids until the nausea and/or vomiting subsides. Call your physician if vomiting continues.  Special Instructions/Symptoms: Your throat may feel dry or sore from the anesthesia or the breathing tube placed in your throat during surgery. If this causes discomfort, gargle with warm salt water. The discomfort should disappear within 24 hours.  If you had a scopolamine patch placed behind your ear for the management of post- operative nausea and/or vomiting:  1. The medication in the patch is effective for 72 hours, after which it should be removed.  Wrap patch in a tissue and discard in the trash. Wash hands thoroughly with soap and water. 2. You may remove the patch earlier than 72 hours if you experience unpleasant side effects which may include dry mouth, dizziness or visual disturbances. 3. Avoid touching the patch. Wash your hands with soap and water after contact with the patch.      

## 2021-02-21 NOTE — Transfer of Care (Signed)
Immediate Anesthesia Transfer of Care Note  Patient: Olivia Hall  Procedure(s) Performed: RIGHT BREAST LUMPECTOMY WITH RADIOACTIVE SEED LOCALIZATION (Right: Breast)  Patient Location: PACU  Anesthesia Type:General  Level of Consciousness: awake, alert  and oriented  Airway & Oxygen Therapy: Patient Spontanous Breathing and Patient connected to nasal cannula oxygen  Post-op Assessment: Report given to RN and Post -op Vital signs reviewed and stable  Post vital signs: Reviewed and stable  Last Vitals:  Vitals Value Taken Time  BP 145/68 02/21/21 1102  Temp    Pulse 92 02/21/21 1104  Resp 20 02/21/21 1103  SpO2 100 % 02/21/21 1104  Vitals shown include unvalidated device data.  Last Pain:  Vitals:   02/21/21 0808  TempSrc: Oral  PainSc: 0-No pain         Complications: No notable events documented.

## 2021-02-22 ENCOUNTER — Encounter (HOSPITAL_BASED_OUTPATIENT_CLINIC_OR_DEPARTMENT_OTHER): Payer: Self-pay | Admitting: Surgery

## 2021-02-23 LAB — SURGICAL PATHOLOGY

## 2021-05-05 ENCOUNTER — Other Ambulatory Visit: Payer: Self-pay

## 2021-05-05 ENCOUNTER — Ambulatory Visit (INDEPENDENT_AMBULATORY_CARE_PROVIDER_SITE_OTHER): Payer: PPO

## 2021-05-05 ENCOUNTER — Encounter: Payer: Self-pay | Admitting: Family Medicine

## 2021-05-05 DIAGNOSIS — Z23 Encounter for immunization: Secondary | ICD-10-CM | POA: Diagnosis not present

## 2021-05-05 NOTE — Progress Notes (Signed)
Patient came in today for Flu vaccine, tolerated vaccine fine.

## 2021-05-12 ENCOUNTER — Other Ambulatory Visit: Payer: Self-pay | Admitting: Legal Medicine

## 2021-05-12 NOTE — Telephone Encounter (Signed)
Refill sent to pharmacy.   

## 2021-06-23 DIAGNOSIS — Z9189 Other specified personal risk factors, not elsewhere classified: Secondary | ICD-10-CM | POA: Diagnosis not present

## 2021-06-29 ENCOUNTER — Other Ambulatory Visit: Payer: Self-pay | Admitting: Surgery

## 2021-06-29 DIAGNOSIS — Z9189 Other specified personal risk factors, not elsewhere classified: Secondary | ICD-10-CM

## 2021-07-22 ENCOUNTER — Other Ambulatory Visit: Payer: Self-pay

## 2021-07-22 ENCOUNTER — Ambulatory Visit
Admission: RE | Admit: 2021-07-22 | Discharge: 2021-07-22 | Disposition: A | Discharge status: Home / Self Care | Payer: PPO | Source: Ambulatory Visit | Attending: Surgery | Admitting: Surgery

## 2021-07-22 DIAGNOSIS — N6489 Other specified disorders of breast: Secondary | ICD-10-CM | POA: Diagnosis not present

## 2021-07-22 DIAGNOSIS — Z9189 Other specified personal risk factors, not elsewhere classified: Secondary | ICD-10-CM

## 2021-07-22 MED ORDER — GADOBUTROL 1 MMOL/ML IV SOLN
10.0000 mL | Freq: Once | INTRAVENOUS | Status: AC | PRN
  Administered 2021-07-22: 10 mL via INTRAVENOUS

## 2021-08-24 DIAGNOSIS — M9901 Segmental and somatic dysfunction of cervical region: Secondary | ICD-10-CM | POA: Diagnosis not present

## 2021-08-24 DIAGNOSIS — M542 Cervicalgia: Secondary | ICD-10-CM | POA: Diagnosis not present

## 2021-08-24 DIAGNOSIS — M9902 Segmental and somatic dysfunction of thoracic region: Secondary | ICD-10-CM | POA: Diagnosis not present

## 2021-08-24 DIAGNOSIS — M4712 Other spondylosis with myelopathy, cervical region: Secondary | ICD-10-CM | POA: Diagnosis not present

## 2021-09-07 DIAGNOSIS — M542 Cervicalgia: Secondary | ICD-10-CM | POA: Diagnosis not present

## 2021-09-07 DIAGNOSIS — M9901 Segmental and somatic dysfunction of cervical region: Secondary | ICD-10-CM | POA: Diagnosis not present

## 2021-09-07 DIAGNOSIS — M9902 Segmental and somatic dysfunction of thoracic region: Secondary | ICD-10-CM | POA: Diagnosis not present

## 2021-09-07 DIAGNOSIS — M4712 Other spondylosis with myelopathy, cervical region: Secondary | ICD-10-CM | POA: Diagnosis not present

## 2021-09-21 DIAGNOSIS — M9902 Segmental and somatic dysfunction of thoracic region: Secondary | ICD-10-CM | POA: Diagnosis not present

## 2021-09-21 DIAGNOSIS — M4712 Other spondylosis with myelopathy, cervical region: Secondary | ICD-10-CM | POA: Diagnosis not present

## 2021-09-21 DIAGNOSIS — M9901 Segmental and somatic dysfunction of cervical region: Secondary | ICD-10-CM | POA: Diagnosis not present

## 2021-09-21 DIAGNOSIS — M542 Cervicalgia: Secondary | ICD-10-CM | POA: Diagnosis not present

## 2021-10-05 DIAGNOSIS — M542 Cervicalgia: Secondary | ICD-10-CM | POA: Diagnosis not present

## 2021-10-05 DIAGNOSIS — M9902 Segmental and somatic dysfunction of thoracic region: Secondary | ICD-10-CM | POA: Diagnosis not present

## 2021-10-05 DIAGNOSIS — M4712 Other spondylosis with myelopathy, cervical region: Secondary | ICD-10-CM | POA: Diagnosis not present

## 2021-10-05 DIAGNOSIS — M9901 Segmental and somatic dysfunction of cervical region: Secondary | ICD-10-CM | POA: Diagnosis not present

## 2021-10-10 ENCOUNTER — Telehealth: Payer: Self-pay | Admitting: Family Medicine

## 2021-10-10 NOTE — Telephone Encounter (Signed)
LEFT VM FOR PT TO CALL us BACK TO MAKE A AWV APPT

## 2021-10-12 DIAGNOSIS — M542 Cervicalgia: Secondary | ICD-10-CM | POA: Diagnosis not present

## 2021-10-12 DIAGNOSIS — M4712 Other spondylosis with myelopathy, cervical region: Secondary | ICD-10-CM | POA: Diagnosis not present

## 2021-10-12 DIAGNOSIS — M9902 Segmental and somatic dysfunction of thoracic region: Secondary | ICD-10-CM | POA: Diagnosis not present

## 2021-10-12 DIAGNOSIS — M9901 Segmental and somatic dysfunction of cervical region: Secondary | ICD-10-CM | POA: Diagnosis not present

## 2021-10-26 DIAGNOSIS — M542 Cervicalgia: Secondary | ICD-10-CM | POA: Diagnosis not present

## 2021-10-26 DIAGNOSIS — M9901 Segmental and somatic dysfunction of cervical region: Secondary | ICD-10-CM | POA: Diagnosis not present

## 2021-10-26 DIAGNOSIS — M9902 Segmental and somatic dysfunction of thoracic region: Secondary | ICD-10-CM | POA: Diagnosis not present

## 2021-10-26 DIAGNOSIS — M4712 Other spondylosis with myelopathy, cervical region: Secondary | ICD-10-CM | POA: Diagnosis not present

## 2021-11-02 DIAGNOSIS — M542 Cervicalgia: Secondary | ICD-10-CM | POA: Diagnosis not present

## 2021-11-02 DIAGNOSIS — M9902 Segmental and somatic dysfunction of thoracic region: Secondary | ICD-10-CM | POA: Diagnosis not present

## 2021-11-02 DIAGNOSIS — M4712 Other spondylosis with myelopathy, cervical region: Secondary | ICD-10-CM | POA: Diagnosis not present

## 2021-11-02 DIAGNOSIS — M9901 Segmental and somatic dysfunction of cervical region: Secondary | ICD-10-CM | POA: Diagnosis not present

## 2021-11-16 DIAGNOSIS — M542 Cervicalgia: Secondary | ICD-10-CM | POA: Diagnosis not present

## 2021-11-16 DIAGNOSIS — M9901 Segmental and somatic dysfunction of cervical region: Secondary | ICD-10-CM | POA: Diagnosis not present

## 2021-11-16 DIAGNOSIS — M9902 Segmental and somatic dysfunction of thoracic region: Secondary | ICD-10-CM | POA: Diagnosis not present

## 2021-11-16 DIAGNOSIS — M4712 Other spondylosis with myelopathy, cervical region: Secondary | ICD-10-CM | POA: Diagnosis not present

## 2021-11-21 ENCOUNTER — Encounter (HOSPITAL_COMMUNITY): Payer: Self-pay

## 2021-12-05 ENCOUNTER — Other Ambulatory Visit: Payer: Self-pay | Admitting: Acute Care

## 2021-12-05 DIAGNOSIS — Z1231 Encounter for screening mammogram for malignant neoplasm of breast: Secondary | ICD-10-CM

## 2021-12-07 DIAGNOSIS — M542 Cervicalgia: Secondary | ICD-10-CM | POA: Diagnosis not present

## 2021-12-07 DIAGNOSIS — M9901 Segmental and somatic dysfunction of cervical region: Secondary | ICD-10-CM | POA: Diagnosis not present

## 2021-12-07 DIAGNOSIS — M9902 Segmental and somatic dysfunction of thoracic region: Secondary | ICD-10-CM | POA: Diagnosis not present

## 2021-12-07 DIAGNOSIS — M4712 Other spondylosis with myelopathy, cervical region: Secondary | ICD-10-CM | POA: Diagnosis not present

## 2021-12-11 DIAGNOSIS — M25561 Pain in right knee: Secondary | ICD-10-CM | POA: Diagnosis not present

## 2021-12-11 DIAGNOSIS — Z96642 Presence of left artificial hip joint: Secondary | ICD-10-CM | POA: Diagnosis not present

## 2021-12-11 DIAGNOSIS — M25552 Pain in left hip: Secondary | ICD-10-CM | POA: Diagnosis not present

## 2021-12-11 DIAGNOSIS — G8929 Other chronic pain: Secondary | ICD-10-CM | POA: Diagnosis not present

## 2021-12-19 ENCOUNTER — Ambulatory Visit: Payer: PPO

## 2021-12-19 ENCOUNTER — Ambulatory Visit
Admission: RE | Admit: 2021-12-19 | Discharge: 2021-12-19 | Disposition: A | Discharge status: Home / Self Care | Payer: PPO | Source: Ambulatory Visit | Attending: Acute Care | Admitting: Acute Care

## 2021-12-19 DIAGNOSIS — Z1231 Encounter for screening mammogram for malignant neoplasm of breast: Secondary | ICD-10-CM | POA: Diagnosis not present

## 2021-12-21 DIAGNOSIS — M4712 Other spondylosis with myelopathy, cervical region: Secondary | ICD-10-CM | POA: Diagnosis not present

## 2021-12-21 DIAGNOSIS — M542 Cervicalgia: Secondary | ICD-10-CM | POA: Diagnosis not present

## 2021-12-21 DIAGNOSIS — M9901 Segmental and somatic dysfunction of cervical region: Secondary | ICD-10-CM | POA: Diagnosis not present

## 2021-12-21 DIAGNOSIS — M9902 Segmental and somatic dysfunction of thoracic region: Secondary | ICD-10-CM | POA: Diagnosis not present

## 2022-01-03 ENCOUNTER — Telehealth: Payer: Self-pay | Admitting: Family Medicine

## 2022-01-03 NOTE — Telephone Encounter (Signed)
Called to schedule AWV, left message for patient to call back

## 2022-01-04 DIAGNOSIS — M9901 Segmental and somatic dysfunction of cervical region: Secondary | ICD-10-CM | POA: Diagnosis not present

## 2022-01-04 DIAGNOSIS — M4712 Other spondylosis with myelopathy, cervical region: Secondary | ICD-10-CM | POA: Diagnosis not present

## 2022-01-04 DIAGNOSIS — M542 Cervicalgia: Secondary | ICD-10-CM | POA: Diagnosis not present

## 2022-01-04 DIAGNOSIS — M9902 Segmental and somatic dysfunction of thoracic region: Secondary | ICD-10-CM | POA: Diagnosis not present

## 2022-01-18 DIAGNOSIS — M4712 Other spondylosis with myelopathy, cervical region: Secondary | ICD-10-CM | POA: Diagnosis not present

## 2022-01-18 DIAGNOSIS — M9901 Segmental and somatic dysfunction of cervical region: Secondary | ICD-10-CM | POA: Diagnosis not present

## 2022-01-18 DIAGNOSIS — M9902 Segmental and somatic dysfunction of thoracic region: Secondary | ICD-10-CM | POA: Diagnosis not present

## 2022-01-18 DIAGNOSIS — M542 Cervicalgia: Secondary | ICD-10-CM | POA: Diagnosis not present

## 2022-01-21 ENCOUNTER — Other Ambulatory Visit: Payer: Self-pay | Admitting: Family Medicine

## 2022-01-24 DIAGNOSIS — H2513 Age-related nuclear cataract, bilateral: Secondary | ICD-10-CM | POA: Diagnosis not present

## 2022-02-01 DIAGNOSIS — M9902 Segmental and somatic dysfunction of thoracic region: Secondary | ICD-10-CM | POA: Diagnosis not present

## 2022-02-01 DIAGNOSIS — M542 Cervicalgia: Secondary | ICD-10-CM | POA: Diagnosis not present

## 2022-02-01 DIAGNOSIS — M4712 Other spondylosis with myelopathy, cervical region: Secondary | ICD-10-CM | POA: Diagnosis not present

## 2022-02-01 DIAGNOSIS — M9901 Segmental and somatic dysfunction of cervical region: Secondary | ICD-10-CM | POA: Diagnosis not present

## 2022-02-02 IMAGING — MG MM BREAST LOCALIZATION CLIP
4 series · 4 of 12 positions shown · non-contrast
Comparison: Previous exam(s).

CLINICAL DATA: Post procedure mammogram for clip placement.

EXAM:
DIAGNOSTIC RIGHT MAMMOGRAM POST ULTRASOUND BIOPSY

[R CC synth-2D]
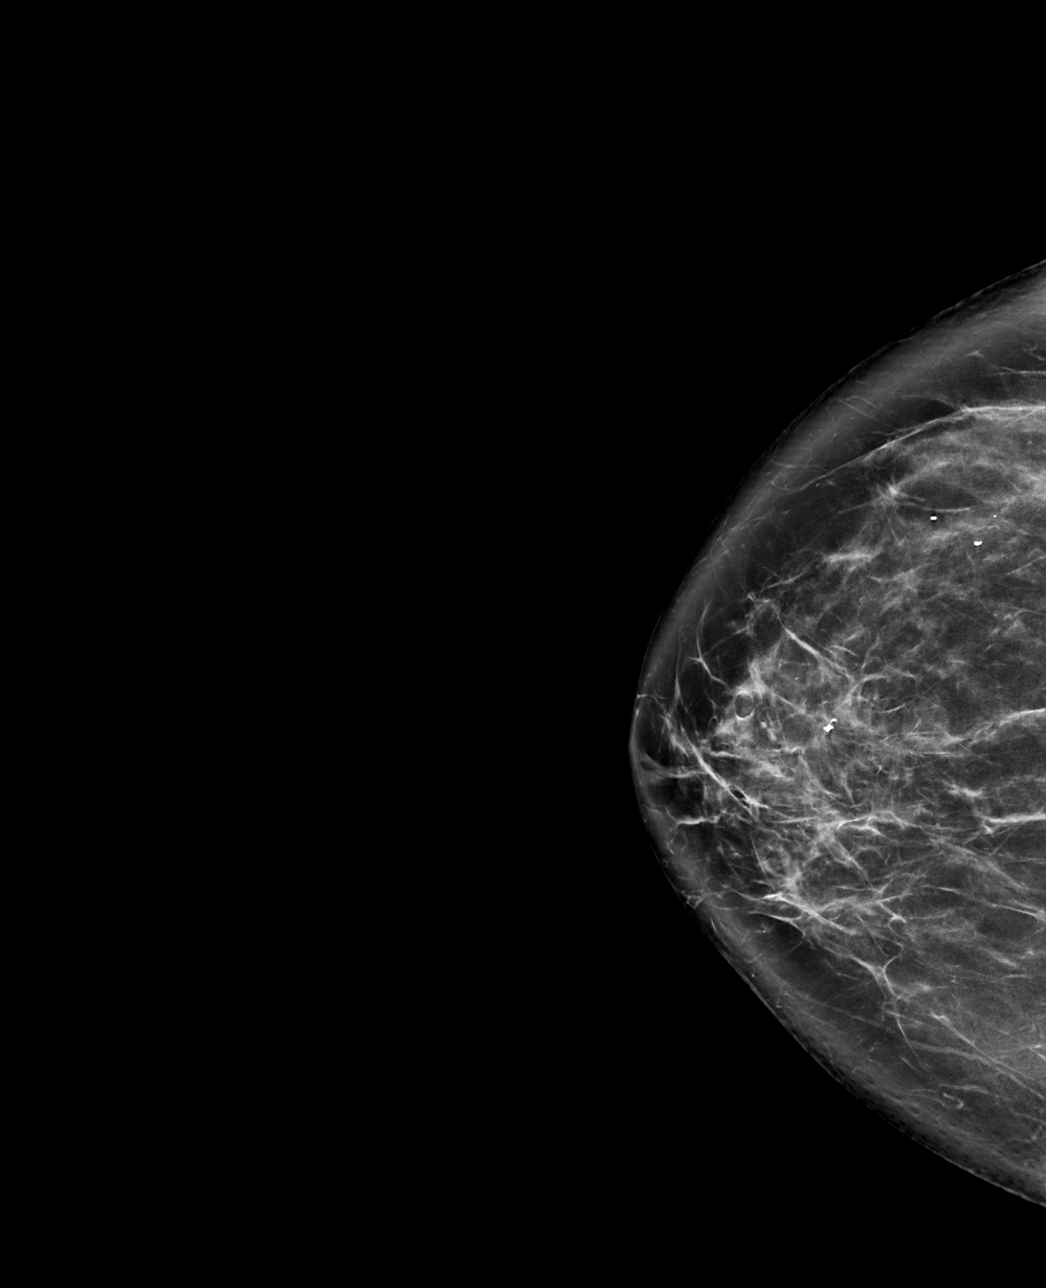

[R ML synth-2D]
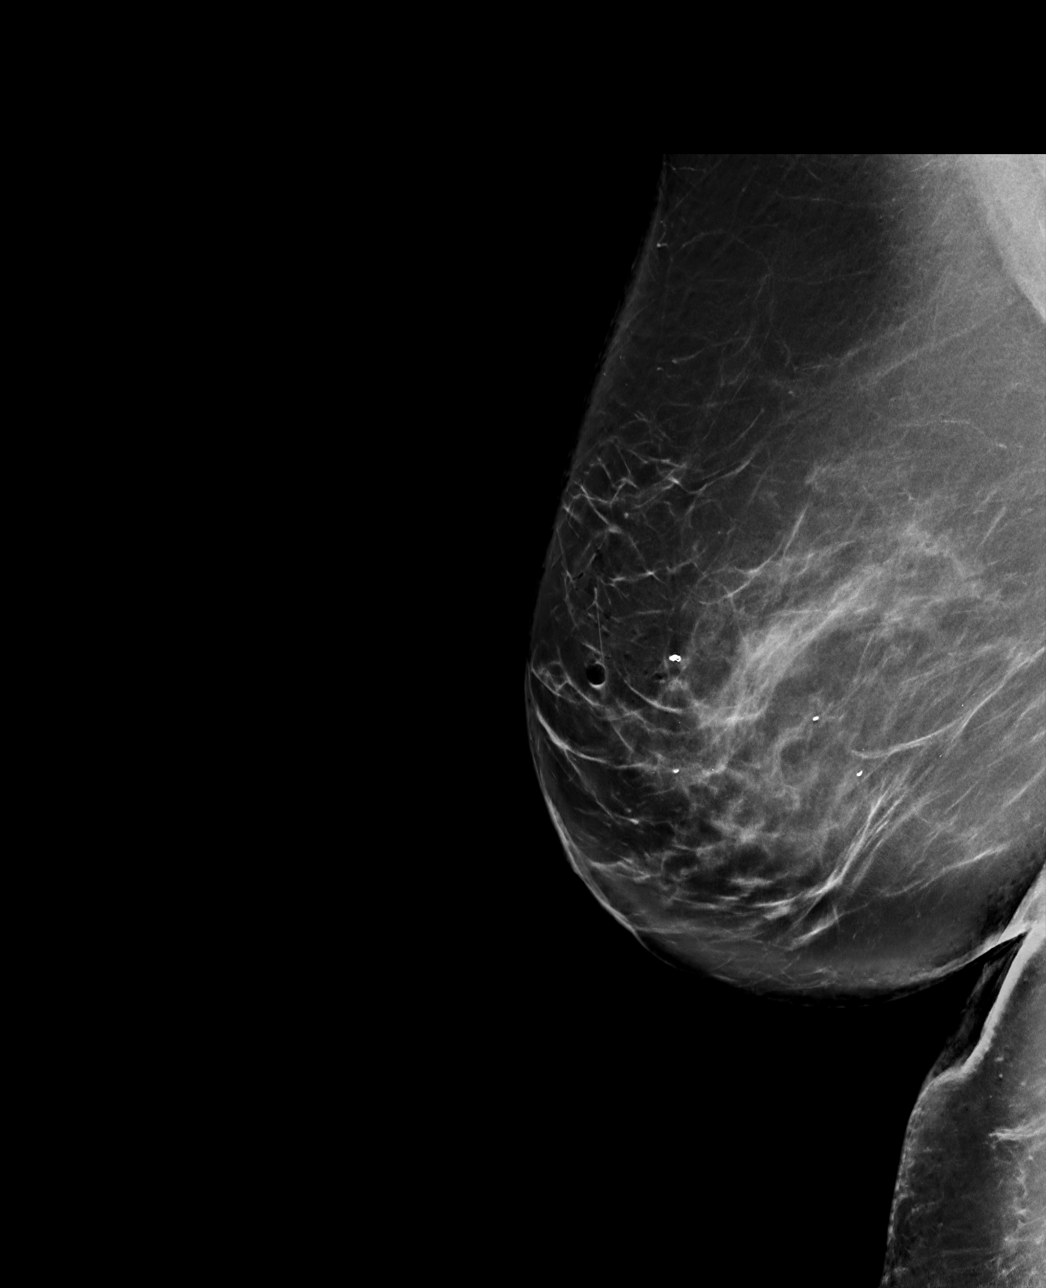

[R CC tomo · tomo slice 47/92.0]
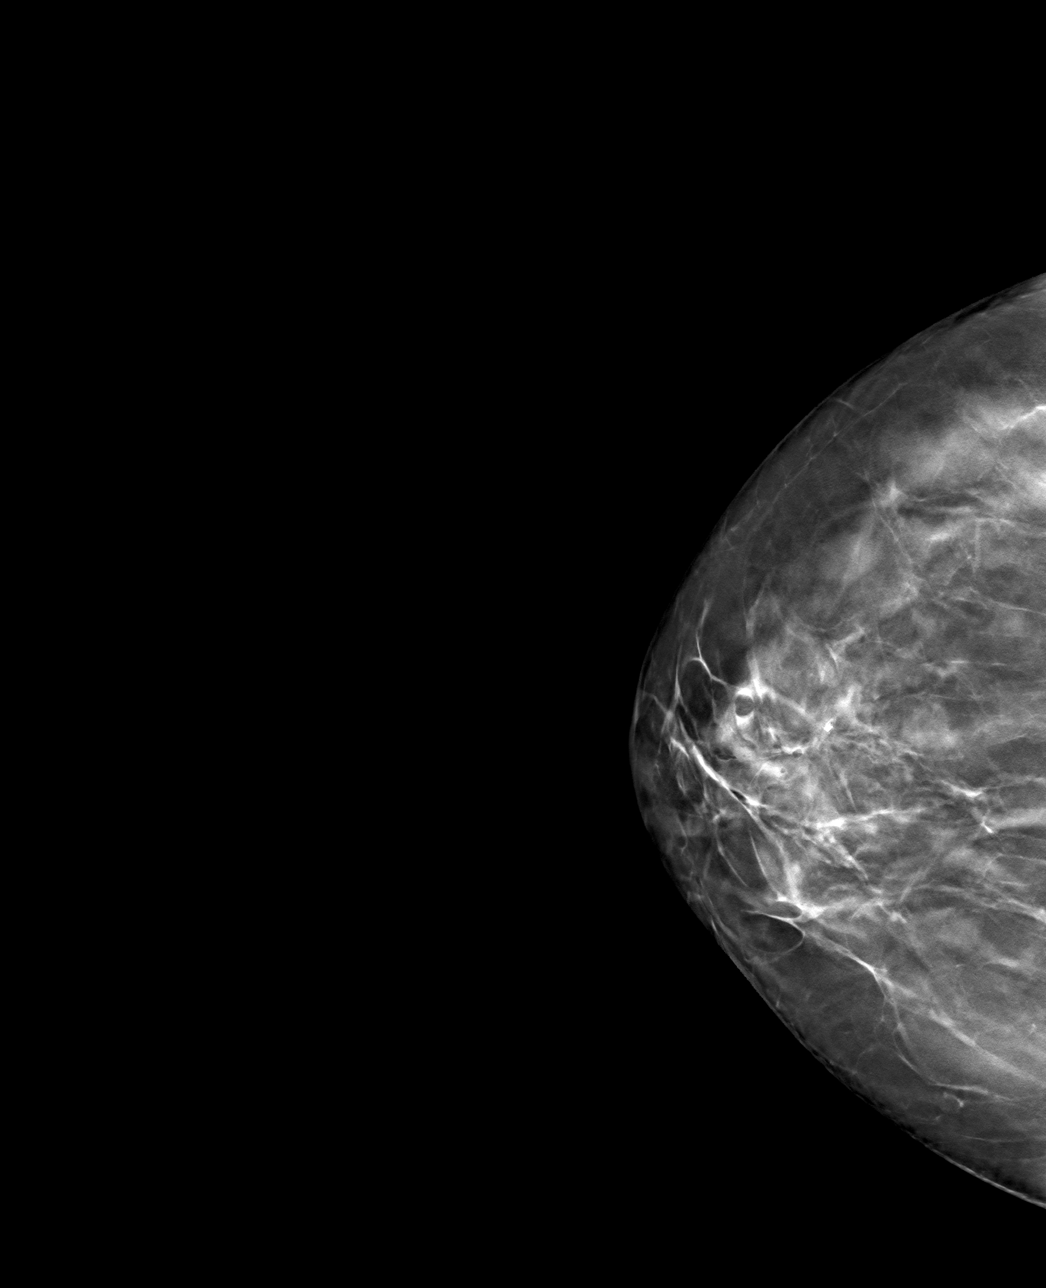

[R ML tomo · tomo slice 56/111.0]
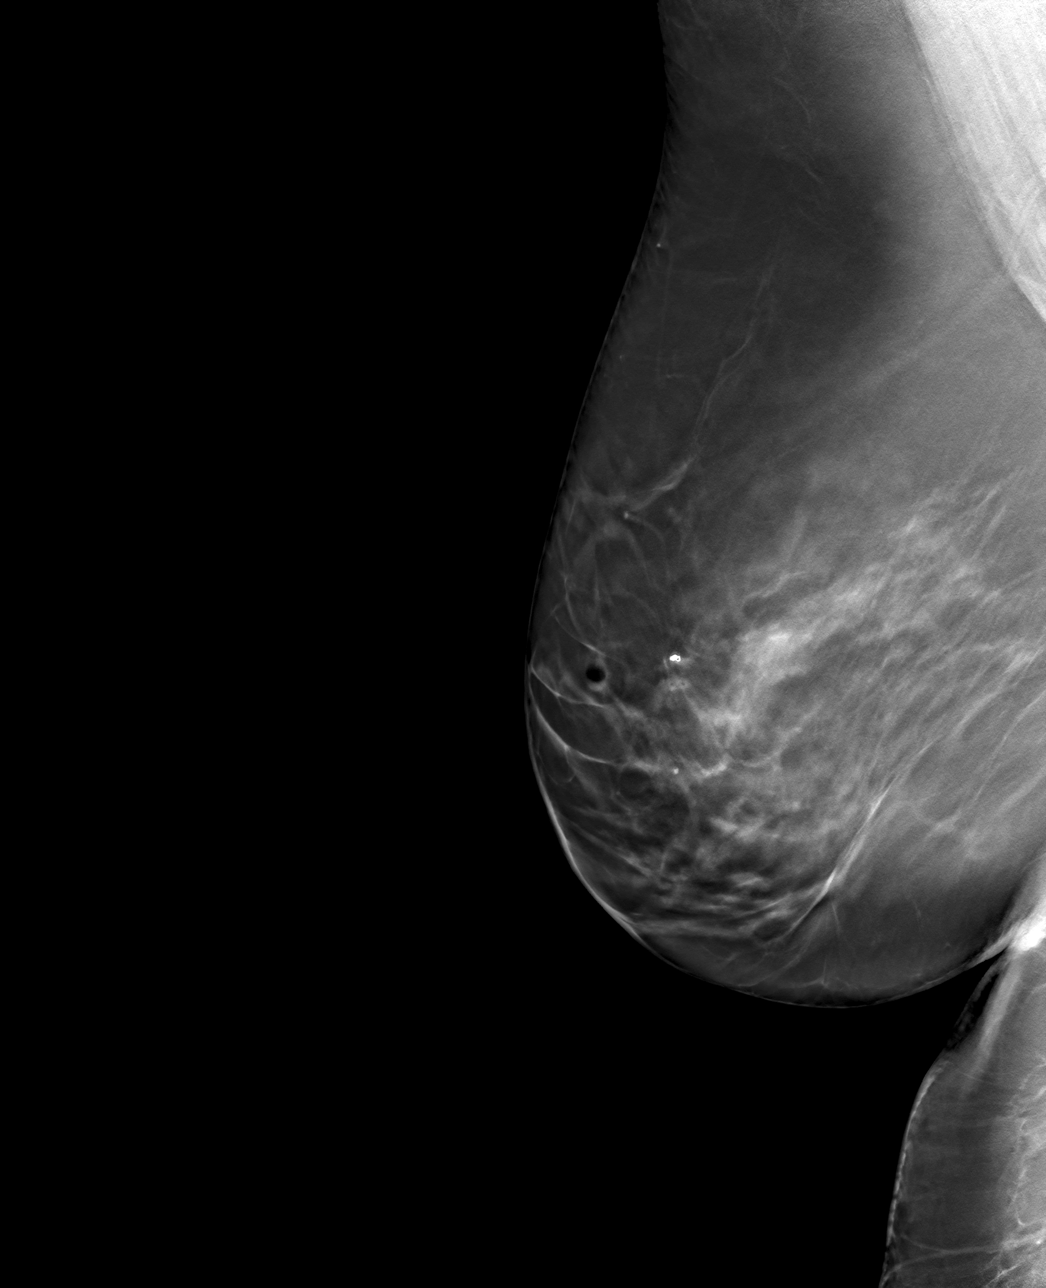

[4 of 12 positions shown; findings below may reference images not displayed]

FINDINGS: Mammographic images were obtained following ultrasound guided biopsy
of distortion in the right breast at 12 o'clock. The biopsy marking
clip is in expected position at the site of biopsy.
IMPRESSION: Appropriate positioning of the coil shaped biopsy marking clip at
the site of biopsy in the right breast at 12 o'clock.

Final Assessment: Post Procedure Mammograms for Marker Placement

## 2022-02-02 IMAGING — US US  BREAST BX W/ LOC DEV 1ST LESION IMG BX SPEC US GUIDE*R*
1 series · 12 of 12 positions shown · non-contrast
Comparison: Previous exam(s).
COMPARISON: Previous exam(s).

Addendum:
CLINICAL DATA: 60-year-old female presenting for biopsy of right
breast architectural distortion.

EXAM:
ULTRASOUND GUIDED RIGHT BREAST CORE NEEDLE BIOPSY

[Series 1: us breast bx w/ loc dev 1st lesion img bx spec us  · 0.07mm/px · 12 of 12 slices shown]
[im 1/12]
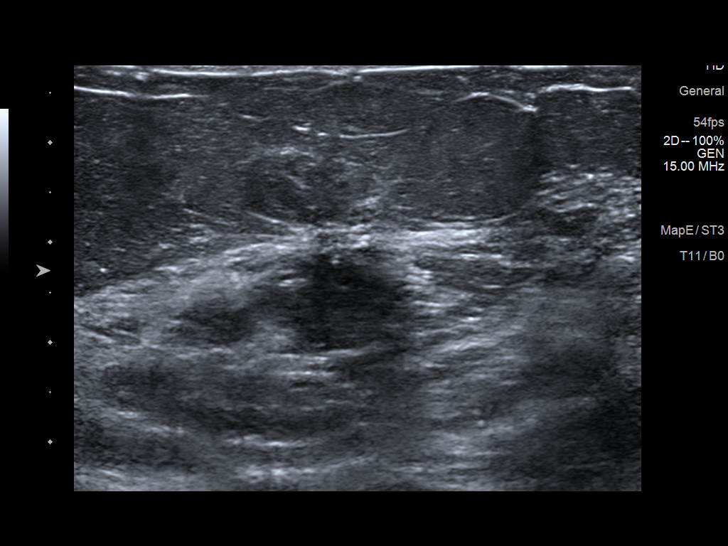
[im 2/12]
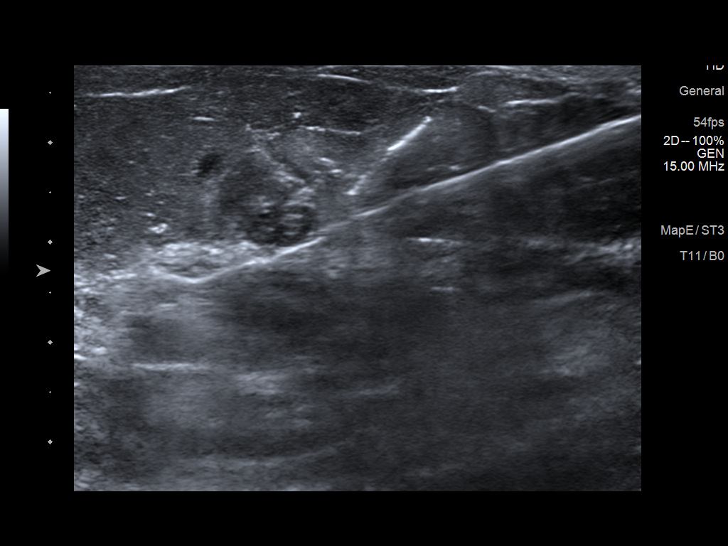
[im 3/12]
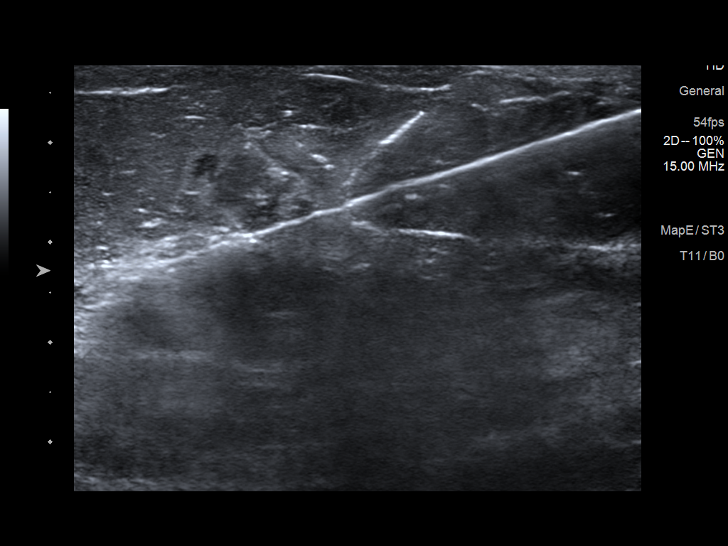
[im 4/12]
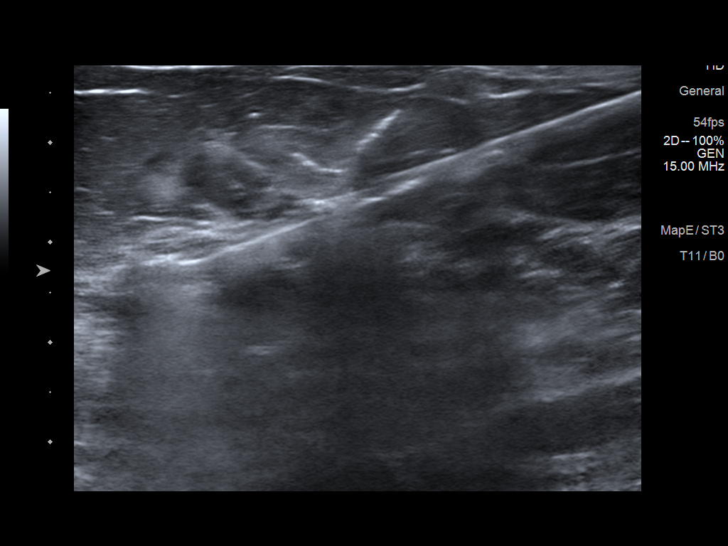
[im 5/12]
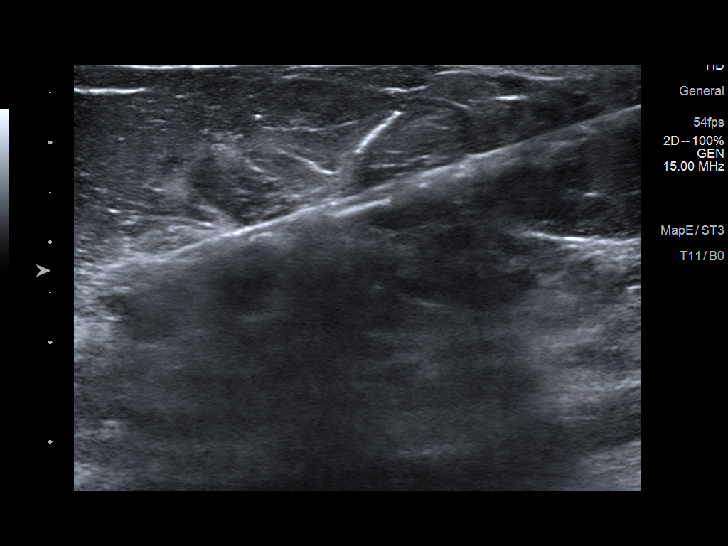
[im 6/12]
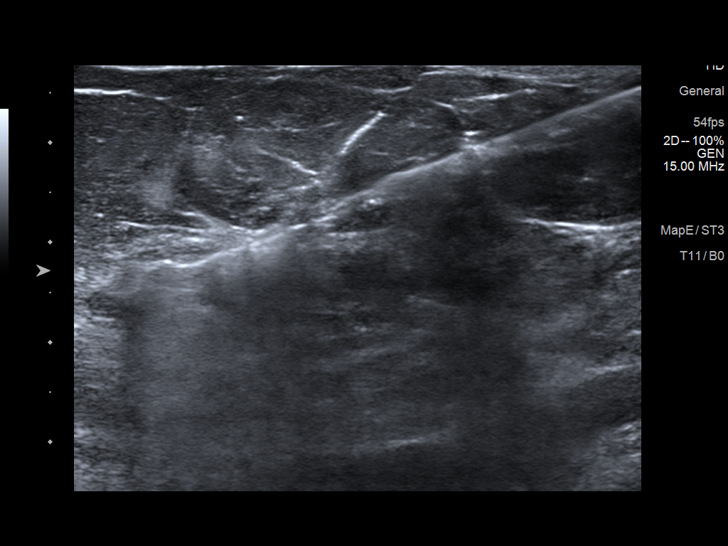
[im 7/12]
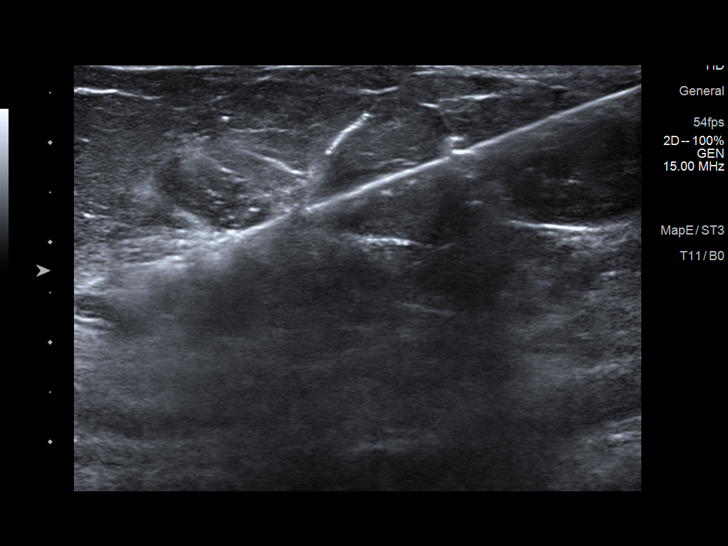
[im 8/12]
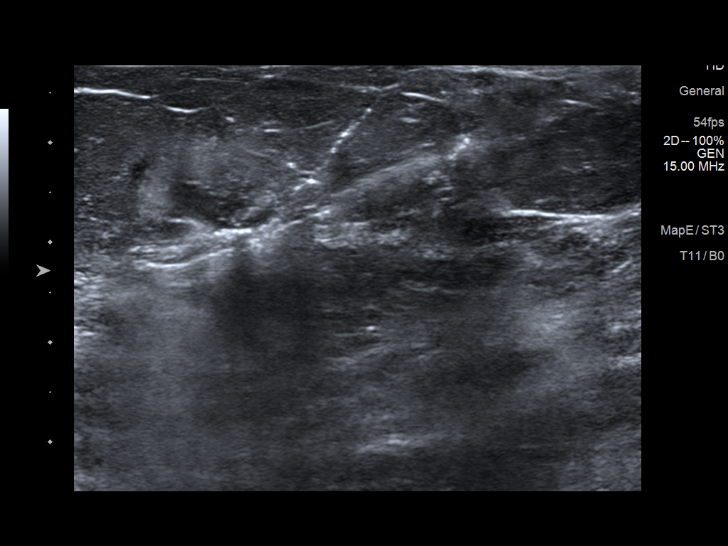
[im 9/12]
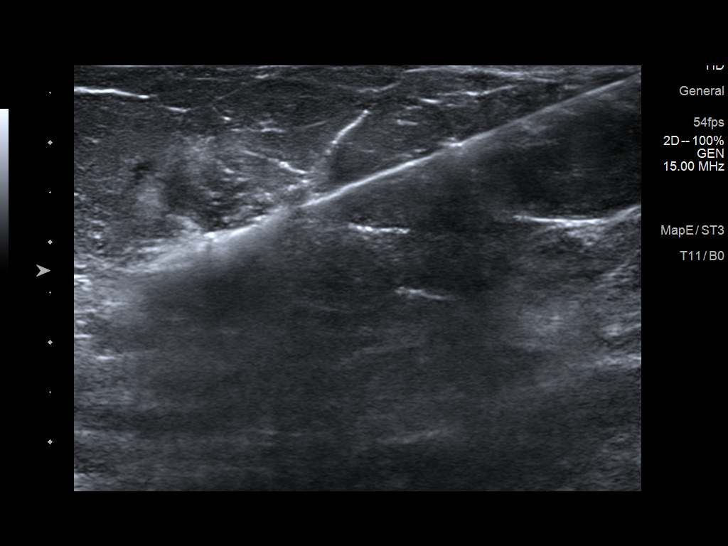
[im 10/12]
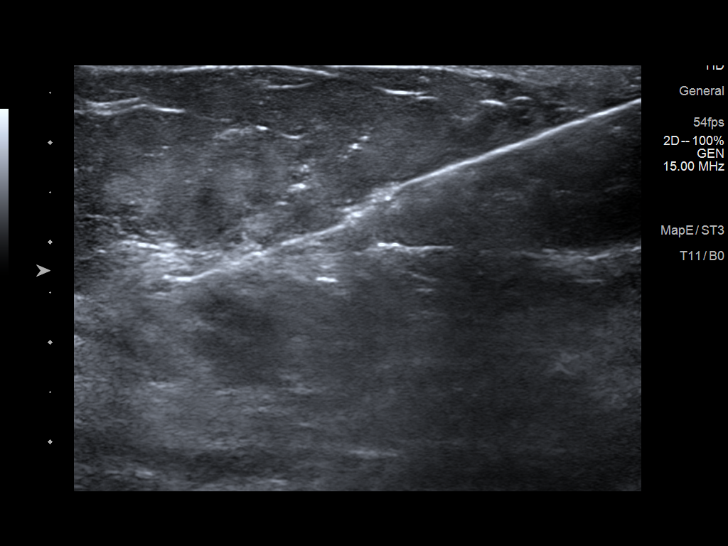
[im 11/12]
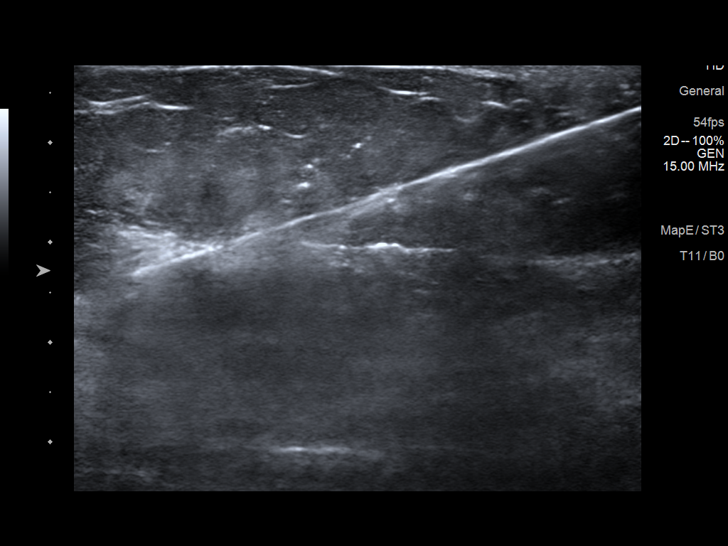
[im 12/12]
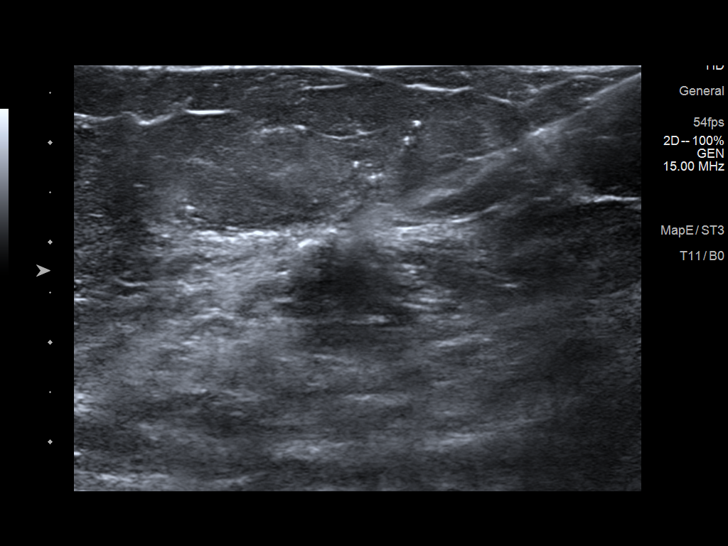

[12 of 12 positions shown; findings below may reference images not displayed]



Lesion quadrant: Upper outer quadrant

Using sterile technique and 1% Lidocaine as local anesthetic, under
direct ultrasound visualization, a 14 gauge Donis device was
used to perform biopsy of distortion in the right breast at 12
o'clock using a medial approach. At the conclusion of the procedure
a coil tissue marker clip was deployed into the biopsy cavity.
Follow up 2 view mammogram was performed and dictated separately.
IMPRESSION: Ultrasound guided biopsy of distortion in the right breast 12
o'clock. No apparent complications.

ADDENDUM:
Pathology revealed COMPLEX SCLEROSING LESION WITH FOCAL ATYPICAL
DUCTAL HYPERPLASIA of the RIGHT breast, 12 o'clock. This was found
to be concordant by Dr. Elita Jax, with excision recommended.

Pathology results were discussed with the patient by telephone. The
patient reported doing well after the biopsy with tenderness at the
site. Post biopsy instructions and care were reviewed and questions
were answered. The patient was encouraged to call The [REDACTED]

Surgical consultation has been arranged with Dr. Labelle Salha Brack Tiger at
[REDACTED] on January 12, 2021.

Pathology results reported by Paulus N Ceejay RN on 12/16/2020.



Lesion quadrant: Upper outer quadrant

Using sterile technique and 1% Lidocaine as local anesthetic, under
direct ultrasound visualization, a 14 gauge Donis device was
used to perform biopsy of distortion in the right breast at 12
o'clock using a medial approach. At the conclusion of the procedure
a coil tissue marker clip was deployed into the biopsy cavity.
Follow up 2 view mammogram was performed and dictated separately.
IMPRESSION: Ultrasound guided biopsy of distortion in the right breast 12
o'clock. No apparent complications.

## 2022-02-06 ENCOUNTER — Other Ambulatory Visit: Payer: Self-pay | Admitting: Legal Medicine

## 2022-02-20 DIAGNOSIS — M542 Cervicalgia: Secondary | ICD-10-CM | POA: Diagnosis not present

## 2022-02-20 DIAGNOSIS — M9901 Segmental and somatic dysfunction of cervical region: Secondary | ICD-10-CM | POA: Diagnosis not present

## 2022-02-20 DIAGNOSIS — M4712 Other spondylosis with myelopathy, cervical region: Secondary | ICD-10-CM | POA: Diagnosis not present

## 2022-02-20 DIAGNOSIS — M9902 Segmental and somatic dysfunction of thoracic region: Secondary | ICD-10-CM | POA: Diagnosis not present

## 2022-02-22 DIAGNOSIS — Z01419 Encounter for gynecological examination (general) (routine) without abnormal findings: Secondary | ICD-10-CM | POA: Diagnosis not present

## 2022-02-22 DIAGNOSIS — Z6836 Body mass index (BMI) 36.0-36.9, adult: Secondary | ICD-10-CM | POA: Diagnosis not present

## 2022-03-02 DIAGNOSIS — J0100 Acute maxillary sinusitis, unspecified: Secondary | ICD-10-CM | POA: Diagnosis not present

## 2022-03-02 DIAGNOSIS — R0981 Nasal congestion: Secondary | ICD-10-CM | POA: Diagnosis not present

## 2022-03-02 DIAGNOSIS — R059 Cough, unspecified: Secondary | ICD-10-CM | POA: Diagnosis not present

## 2022-03-08 DIAGNOSIS — M542 Cervicalgia: Secondary | ICD-10-CM | POA: Diagnosis not present

## 2022-03-08 DIAGNOSIS — M4712 Other spondylosis with myelopathy, cervical region: Secondary | ICD-10-CM | POA: Diagnosis not present

## 2022-03-08 DIAGNOSIS — M9901 Segmental and somatic dysfunction of cervical region: Secondary | ICD-10-CM | POA: Diagnosis not present

## 2022-03-08 DIAGNOSIS — M9902 Segmental and somatic dysfunction of thoracic region: Secondary | ICD-10-CM | POA: Diagnosis not present

## 2022-03-22 DIAGNOSIS — M4712 Other spondylosis with myelopathy, cervical region: Secondary | ICD-10-CM | POA: Diagnosis not present

## 2022-03-22 DIAGNOSIS — M9901 Segmental and somatic dysfunction of cervical region: Secondary | ICD-10-CM | POA: Diagnosis not present

## 2022-03-22 DIAGNOSIS — M542 Cervicalgia: Secondary | ICD-10-CM | POA: Diagnosis not present

## 2022-03-22 DIAGNOSIS — M9902 Segmental and somatic dysfunction of thoracic region: Secondary | ICD-10-CM | POA: Diagnosis not present

## 2022-03-23 DIAGNOSIS — Z9189 Other specified personal risk factors, not elsewhere classified: Secondary | ICD-10-CM | POA: Diagnosis not present

## 2022-04-03 DIAGNOSIS — M4712 Other spondylosis with myelopathy, cervical region: Secondary | ICD-10-CM | POA: Diagnosis not present

## 2022-04-03 DIAGNOSIS — M542 Cervicalgia: Secondary | ICD-10-CM | POA: Diagnosis not present

## 2022-04-03 DIAGNOSIS — M9902 Segmental and somatic dysfunction of thoracic region: Secondary | ICD-10-CM | POA: Diagnosis not present

## 2022-04-03 DIAGNOSIS — M9901 Segmental and somatic dysfunction of cervical region: Secondary | ICD-10-CM | POA: Diagnosis not present

## 2022-04-04 ENCOUNTER — Encounter: Payer: Self-pay | Admitting: Family Medicine

## 2022-04-04 ENCOUNTER — Ambulatory Visit (INDEPENDENT_AMBULATORY_CARE_PROVIDER_SITE_OTHER): Payer: PPO | Admitting: Family Medicine

## 2022-04-04 VITALS — BP 134/88 | HR 84 | Temp 96.1°F | Resp 18 | Ht 63.5 in | Wt 205.0 lb

## 2022-04-04 DIAGNOSIS — M4150 Other secondary scoliosis, site unspecified: Secondary | ICD-10-CM

## 2022-04-04 DIAGNOSIS — E782 Mixed hyperlipidemia: Secondary | ICD-10-CM | POA: Diagnosis not present

## 2022-04-04 DIAGNOSIS — Z0000 Encounter for general adult medical examination without abnormal findings: Secondary | ICD-10-CM | POA: Diagnosis not present

## 2022-04-04 DIAGNOSIS — I10 Essential (primary) hypertension: Secondary | ICD-10-CM | POA: Diagnosis not present

## 2022-04-04 DIAGNOSIS — Z6835 Body mass index (BMI) 35.0-35.9, adult: Secondary | ICD-10-CM | POA: Diagnosis not present

## 2022-04-04 MED ORDER — LISINOPRIL 10 MG PO TABS: 10.0000 mg | ORAL_TABLET | Freq: Every day | ORAL | 3 refills | Status: DC
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
## 2022-04-04 NOTE — Patient Instructions (Addendum)
Recommend tetanus (tdap) vaccine and shingrix vaccine series.   Things to do to keep yourself healthy  - Exercise at least 30-45 minutes a day, 3-4 days a week.  - Eat a low-fat diet with lots of fruits and vegetables, up to 7-9 servings per day.  - Seatbelts can save your life. Wear them always.  - Smoke detectors on every level of your home, check batteries every year.  - Eye Doctor - have an eye exam every 1-2 years  - Safe sex - if you may be exposed to STDs, use a condom.  - Alcohol -  If you drink, do it moderately, less than 2 drinks per day.  - [place]. Choose someone to speak for you if you are not able.  - Depression is common in our stressful world.If you're feeling down or losing interest in things you normally enjoy, please come in for a visit.  - Violence - If anyone is threatening or hurting you, please call immediately.

## 2022-04-05 DIAGNOSIS — I10 Essential (primary) hypertension: Secondary | ICD-10-CM | POA: Insufficient documentation

## 2022-04-05 LAB — COMPREHENSIVE METABOLIC PANEL
ALT: 20 IU/L (ref 0–32)
AST: 23 IU/L (ref 0–40)
Albumin/Globulin Ratio: 1.8 (ref 1.2–2.2)
Albumin: 4.5 g/dL (ref 3.9–4.9)
Alkaline Phosphatase: 115 IU/L (ref 44–121)
BUN/Creatinine Ratio: 24 (ref 12–28)
BUN: 16 mg/dL (ref 8–27)
Bilirubin Total: 0.4 mg/dL (ref 0.0–1.2)
CO2: 26 mmol/L (ref 20–29)
Calcium: 9.8 mg/dL (ref 8.7–10.3)
Chloride: 100 mmol/L (ref 96–106)
Creatinine, Ser: 0.68 mg/dL (ref 0.57–1.00)
Globulin, Total: 2.5 g/dL (ref 1.5–4.5)
Glucose: 80 mg/dL (ref 70–99)
Potassium: 5 mmol/L (ref 3.5–5.2)
Sodium: 140 mmol/L (ref 134–144)
Total Protein: 7 g/dL (ref 6.0–8.5)
eGFR: 98 mL/min/{1.73_m2} (ref >59)

## 2022-04-05 LAB — CBC WITH DIFFERENTIAL/PLATELET
Basophils Absolute: 0.1 10*3/uL (ref 0.0–0.2)
Basos: 1 %
EOS (ABSOLUTE): 0.1 10*3/uL (ref 0.0–0.4)
Eos: 1 %
Hematocrit: 44.8 % (ref 34.0–46.6)
Hemoglobin: 15 g/dL (ref 11.1–15.9)
Immature Grans (Abs): 0 10*3/uL (ref 0.0–0.1)
Immature Granulocytes: 0 %
Lymphocytes Absolute: 1.6 10*3/uL (ref 0.7–3.1)
Lymphs: 31 %
MCH: 29.7 pg (ref 26.6–33.0)
MCHC: 33.5 g/dL (ref 31.5–35.7)
MCV: 89 fL (ref 79–97)
Monocytes Absolute: 0.6 10*3/uL (ref 0.1–0.9)
Monocytes: 12 %
Neutrophils Absolute: 2.9 10*3/uL (ref 1.4–7.0)
Neutrophils: 55 %
Platelets: 331 10*3/uL (ref 150–450)
RBC: 5.05 x10E6/uL (ref 3.77–5.28)
RDW: 11.9 % (ref 11.7–15.4)
WBC: 5.3 10*3/uL (ref 3.4–10.8)

## 2022-04-05 LAB — LIPID PANEL
Chol/HDL Ratio: 3 ratio (ref 0.0–4.4)
Cholesterol, Total: 207 mg/dL — ABNORMAL HIGH (ref 100–199)
HDL: 68 mg/dL (ref >39)
LDL Chol Calc (NIH): 120 mg/dL — ABNORMAL HIGH (ref 0–99)
Triglycerides: 109 mg/dL (ref 0–149)
VLDL Cholesterol Cal: 19 mg/dL (ref 5–40)

## 2022-04-05 LAB — TSH: TSH: 1.12 u[IU]/mL (ref 0.450–4.500)

## 2022-04-05 NOTE — Assessment & Plan Note (Signed)
Well controlled.  No changes to medicines. Lisinopril 10 mg daily. Continue to work on eating a healthy diet and exercise.  Labs drawn today.  

## 2022-04-08 ENCOUNTER — Encounter: Payer: Self-pay | Admitting: Family Medicine

## 2022-04-08 DIAGNOSIS — Z86718 Personal history of other venous thrombosis and embolism: Secondary | ICD-10-CM | POA: Insufficient documentation

## 2022-04-08 DIAGNOSIS — E782 Mixed hyperlipidemia: Secondary | ICD-10-CM

## 2022-04-08 DIAGNOSIS — Z0000 Encounter for general adult medical examination without abnormal findings: Secondary | ICD-10-CM | POA: Insufficient documentation

## 2022-04-08 DIAGNOSIS — E66812 Obesity, class 2: Secondary | ICD-10-CM | POA: Insufficient documentation

## 2022-04-08 DIAGNOSIS — D259 Leiomyoma of uterus, unspecified: Secondary | ICD-10-CM | POA: Insufficient documentation

## 2022-04-08 HISTORY — DX: Mixed hyperlipidemia: E78.2

## 2022-04-08 NOTE — Assessment & Plan Note (Signed)
Continue gabapentin 300 mg three times a day

## 2022-04-08 NOTE — Assessment & Plan Note (Signed)
Recommend continue to work on eating healthy diet and exercise.  
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
## 2022-04-08 NOTE — Assessment & Plan Note (Signed)
Recommend continue to work on eating healthy diet and exercise. Recheck in 6 months fasting.  

## 2022-04-08 NOTE — Assessment & Plan Note (Signed)

## 2022-04-26 DIAGNOSIS — M9901 Segmental and somatic dysfunction of cervical region: Secondary | ICD-10-CM | POA: Diagnosis not present

## 2022-04-26 DIAGNOSIS — M542 Cervicalgia: Secondary | ICD-10-CM | POA: Diagnosis not present

## 2022-04-26 DIAGNOSIS — M9902 Segmental and somatic dysfunction of thoracic region: Secondary | ICD-10-CM | POA: Diagnosis not present

## 2022-04-26 DIAGNOSIS — M4712 Other spondylosis with myelopathy, cervical region: Secondary | ICD-10-CM | POA: Diagnosis not present

## 2022-05-10 DIAGNOSIS — M542 Cervicalgia: Secondary | ICD-10-CM | POA: Diagnosis not present

## 2022-05-10 DIAGNOSIS — M4712 Other spondylosis with myelopathy, cervical region: Secondary | ICD-10-CM | POA: Diagnosis not present

## 2022-05-10 DIAGNOSIS — M9901 Segmental and somatic dysfunction of cervical region: Secondary | ICD-10-CM | POA: Diagnosis not present

## 2022-05-10 DIAGNOSIS — M9902 Segmental and somatic dysfunction of thoracic region: Secondary | ICD-10-CM | POA: Diagnosis not present

## 2022-05-24 DIAGNOSIS — M4712 Other spondylosis with myelopathy, cervical region: Secondary | ICD-10-CM | POA: Diagnosis not present

## 2022-05-24 DIAGNOSIS — M9901 Segmental and somatic dysfunction of cervical region: Secondary | ICD-10-CM | POA: Diagnosis not present

## 2022-05-24 DIAGNOSIS — M9902 Segmental and somatic dysfunction of thoracic region: Secondary | ICD-10-CM | POA: Diagnosis not present

## 2022-05-24 DIAGNOSIS — M542 Cervicalgia: Secondary | ICD-10-CM | POA: Diagnosis not present

## 2022-05-29 ENCOUNTER — Other Ambulatory Visit: Payer: Self-pay | Admitting: Family Medicine

## 2022-06-07 DIAGNOSIS — M9901 Segmental and somatic dysfunction of cervical region: Secondary | ICD-10-CM | POA: Diagnosis not present

## 2022-06-07 DIAGNOSIS — M4712 Other spondylosis with myelopathy, cervical region: Secondary | ICD-10-CM | POA: Diagnosis not present

## 2022-06-07 DIAGNOSIS — M542 Cervicalgia: Secondary | ICD-10-CM | POA: Diagnosis not present

## 2022-06-07 DIAGNOSIS — M9902 Segmental and somatic dysfunction of thoracic region: Secondary | ICD-10-CM | POA: Diagnosis not present

## 2022-06-21 DIAGNOSIS — M9902 Segmental and somatic dysfunction of thoracic region: Secondary | ICD-10-CM | POA: Diagnosis not present

## 2022-06-21 DIAGNOSIS — M9901 Segmental and somatic dysfunction of cervical region: Secondary | ICD-10-CM | POA: Diagnosis not present

## 2022-06-21 DIAGNOSIS — M4712 Other spondylosis with myelopathy, cervical region: Secondary | ICD-10-CM | POA: Diagnosis not present

## 2022-06-21 DIAGNOSIS — M542 Cervicalgia: Secondary | ICD-10-CM | POA: Diagnosis not present

## 2022-07-12 DIAGNOSIS — M9902 Segmental and somatic dysfunction of thoracic region: Secondary | ICD-10-CM | POA: Diagnosis not present

## 2022-07-12 DIAGNOSIS — M9901 Segmental and somatic dysfunction of cervical region: Secondary | ICD-10-CM | POA: Diagnosis not present

## 2022-07-12 DIAGNOSIS — M542 Cervicalgia: Secondary | ICD-10-CM | POA: Diagnosis not present

## 2022-07-12 DIAGNOSIS — M4712 Other spondylosis with myelopathy, cervical region: Secondary | ICD-10-CM | POA: Diagnosis not present

## 2022-07-26 DIAGNOSIS — M9902 Segmental and somatic dysfunction of thoracic region: Secondary | ICD-10-CM | POA: Diagnosis not present

## 2022-07-26 DIAGNOSIS — M4712 Other spondylosis with myelopathy, cervical region: Secondary | ICD-10-CM | POA: Diagnosis not present

## 2022-07-26 DIAGNOSIS — M542 Cervicalgia: Secondary | ICD-10-CM | POA: Diagnosis not present

## 2022-07-26 DIAGNOSIS — M9901 Segmental and somatic dysfunction of cervical region: Secondary | ICD-10-CM | POA: Diagnosis not present

## 2022-08-09 DIAGNOSIS — M9901 Segmental and somatic dysfunction of cervical region: Secondary | ICD-10-CM | POA: Diagnosis not present

## 2022-08-09 DIAGNOSIS — M4712 Other spondylosis with myelopathy, cervical region: Secondary | ICD-10-CM | POA: Diagnosis not present

## 2022-08-09 DIAGNOSIS — M9902 Segmental and somatic dysfunction of thoracic region: Secondary | ICD-10-CM | POA: Diagnosis not present

## 2022-08-09 DIAGNOSIS — M542 Cervicalgia: Secondary | ICD-10-CM | POA: Diagnosis not present

## 2022-08-23 DIAGNOSIS — M4712 Other spondylosis with myelopathy, cervical region: Secondary | ICD-10-CM | POA: Diagnosis not present

## 2022-08-23 DIAGNOSIS — M9902 Segmental and somatic dysfunction of thoracic region: Secondary | ICD-10-CM | POA: Diagnosis not present

## 2022-08-23 DIAGNOSIS — M542 Cervicalgia: Secondary | ICD-10-CM | POA: Diagnosis not present

## 2022-08-23 DIAGNOSIS — M9901 Segmental and somatic dysfunction of cervical region: Secondary | ICD-10-CM | POA: Diagnosis not present

## 2022-09-06 DIAGNOSIS — M9902 Segmental and somatic dysfunction of thoracic region: Secondary | ICD-10-CM | POA: Diagnosis not present

## 2022-09-06 DIAGNOSIS — M9901 Segmental and somatic dysfunction of cervical region: Secondary | ICD-10-CM | POA: Diagnosis not present

## 2022-09-06 DIAGNOSIS — M542 Cervicalgia: Secondary | ICD-10-CM | POA: Diagnosis not present

## 2022-09-06 DIAGNOSIS — M4712 Other spondylosis with myelopathy, cervical region: Secondary | ICD-10-CM | POA: Diagnosis not present

## 2022-10-04 DIAGNOSIS — M542 Cervicalgia: Secondary | ICD-10-CM | POA: Diagnosis not present

## 2022-10-04 DIAGNOSIS — M9901 Segmental and somatic dysfunction of cervical region: Secondary | ICD-10-CM | POA: Diagnosis not present

## 2022-10-04 DIAGNOSIS — M4712 Other spondylosis with myelopathy, cervical region: Secondary | ICD-10-CM | POA: Diagnosis not present

## 2022-10-04 DIAGNOSIS — M9902 Segmental and somatic dysfunction of thoracic region: Secondary | ICD-10-CM | POA: Diagnosis not present

## 2022-10-09 ENCOUNTER — Encounter: Payer: Self-pay | Admitting: Family Medicine

## 2022-10-09 ENCOUNTER — Ambulatory Visit (INDEPENDENT_AMBULATORY_CARE_PROVIDER_SITE_OTHER): Payer: PPO | Admitting: Family Medicine

## 2022-10-09 VITALS — BP 132/68 | HR 72 | Temp 97.6°F | Ht 63.5 in | Wt 206.0 lb

## 2022-10-09 DIAGNOSIS — E782 Mixed hyperlipidemia: Secondary | ICD-10-CM

## 2022-10-09 DIAGNOSIS — Z6835 Body mass index (BMI) 35.0-35.9, adult: Secondary | ICD-10-CM | POA: Diagnosis not present

## 2022-10-09 DIAGNOSIS — Z1231 Encounter for screening mammogram for malignant neoplasm of breast: Secondary | ICD-10-CM

## 2022-10-09 DIAGNOSIS — I10 Essential (primary) hypertension: Secondary | ICD-10-CM | POA: Diagnosis not present

## 2022-10-09 MED ORDER — LISINOPRIL 10 MG PO TABS: 10.0000 mg | ORAL_TABLET | Freq: Every day | ORAL | 3 refills | Status: DC

## 2022-10-09 NOTE — Assessment & Plan Note (Signed)
Recommend continue to work on eating healthy diet and exercise. Comorbidities: hypertension and hyperlipidemia.

## 2022-10-09 NOTE — Assessment & Plan Note (Signed)
Well controlled.  No changes to medicines. Lisinopril 10 mg daily. Continue to work on eating a healthy diet and exercise.  Labs drawn today.
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
## 2022-10-09 NOTE — Assessment & Plan Note (Signed)
Recommend continue to work on eating healthy diet and exercise. Recheck in 6 months fasting.

## 2022-10-09 NOTE — Patient Instructions (Addendum)
Elevate, ice, compression socks and avoid salt to help with swelling in left lower leg. Get RSV vaccine and shingrix vaccines at the pharmacy.

## 2022-10-10 LAB — CBC WITH DIFFERENTIAL/PLATELET
Basophils Absolute: 0.1 10*3/uL (ref 0.0–0.2)
Basos: 1 %
EOS (ABSOLUTE): 0.1 10*3/uL (ref 0.0–0.4)
Eos: 2 %
Hematocrit: 43.4 % (ref 34.0–46.6)
Hemoglobin: 14.7 g/dL (ref 11.1–15.9)
Immature Grans (Abs): 0 10*3/uL (ref 0.0–0.1)
Immature Granulocytes: 0 %
Lymphocytes Absolute: 1.4 10*3/uL (ref 0.7–3.1)
Lymphs: 33 %
MCH: 29.8 pg (ref 26.6–33.0)
MCHC: 33.9 g/dL (ref 31.5–35.7)
MCV: 88 fL (ref 79–97)
Monocytes Absolute: 0.5 10*3/uL (ref 0.1–0.9)
Monocytes: 11 %
Neutrophils Absolute: 2.3 10*3/uL (ref 1.4–7.0)
Neutrophils: 53 %
Platelets: 288 10*3/uL (ref 150–450)
RBC: 4.93 x10E6/uL (ref 3.77–5.28)
RDW: 12.1 % (ref 11.7–15.4)
WBC: 4.3 10*3/uL (ref 3.4–10.8)

## 2022-10-10 LAB — COMPREHENSIVE METABOLIC PANEL
ALT: 23 IU/L (ref 0–32)
AST: 23 IU/L (ref 0–40)
Albumin/Globulin Ratio: 1.8 (ref 1.2–2.2)
Albumin: 4.7 g/dL (ref 3.9–4.9)
Alkaline Phosphatase: 115 IU/L (ref 44–121)
BUN/Creatinine Ratio: 18 (ref 12–28)
BUN: 13 mg/dL (ref 8–27)
Bilirubin Total: 0.4 mg/dL (ref 0.0–1.2)
CO2: 26 mmol/L (ref 20–29)
Calcium: 9.8 mg/dL (ref 8.7–10.3)
Chloride: 99 mmol/L (ref 96–106)
Creatinine, Ser: 0.71 mg/dL (ref 0.57–1.00)
Globulin, Total: 2.6 g/dL (ref 1.5–4.5)
Glucose: 84 mg/dL (ref 70–99)
Potassium: 5 mmol/L (ref 3.5–5.2)
Sodium: 141 mmol/L (ref 134–144)
Total Protein: 7.3 g/dL (ref 6.0–8.5)
eGFR: 96 mL/min/{1.73_m2} (ref >59)

## 2022-10-10 LAB — CARDIOVASCULAR RISK ASSESSMENT

## 2022-10-10 LAB — LIPID PANEL
Chol/HDL Ratio: 2.7 ratio (ref 0.0–4.4)
Cholesterol, Total: 207 mg/dL — ABNORMAL HIGH (ref 100–199)
HDL: 78 mg/dL (ref >39)
LDL Chol Calc (NIH): 110 mg/dL — ABNORMAL HIGH (ref 0–99)
Triglycerides: 109 mg/dL (ref 0–149)
VLDL Cholesterol Cal: 19 mg/dL (ref 5–40)

## 2022-10-10 NOTE — Progress Notes (Signed)
Blood count normal.  Liver function normal.  Kidney function normal.  Cholesterol: LDL  improved to 110. Goal is less than 100.  Recommend continue to work on eating healthy,  low fat diet and exercise.

## 2022-10-18 DIAGNOSIS — M4712 Other spondylosis with myelopathy, cervical region: Secondary | ICD-10-CM | POA: Diagnosis not present

## 2022-10-18 DIAGNOSIS — M542 Cervicalgia: Secondary | ICD-10-CM | POA: Diagnosis not present

## 2022-10-18 DIAGNOSIS — M9901 Segmental and somatic dysfunction of cervical region: Secondary | ICD-10-CM | POA: Diagnosis not present

## 2022-10-18 DIAGNOSIS — M9902 Segmental and somatic dysfunction of thoracic region: Secondary | ICD-10-CM | POA: Diagnosis not present

## 2022-10-22 DIAGNOSIS — M25561 Pain in right knee: Secondary | ICD-10-CM | POA: Diagnosis not present

## 2022-11-01 DIAGNOSIS — M9901 Segmental and somatic dysfunction of cervical region: Secondary | ICD-10-CM | POA: Diagnosis not present

## 2022-11-01 DIAGNOSIS — M4712 Other spondylosis with myelopathy, cervical region: Secondary | ICD-10-CM | POA: Diagnosis not present

## 2022-11-01 DIAGNOSIS — M542 Cervicalgia: Secondary | ICD-10-CM | POA: Diagnosis not present

## 2022-11-01 DIAGNOSIS — M9902 Segmental and somatic dysfunction of thoracic region: Secondary | ICD-10-CM | POA: Diagnosis not present

## 2022-11-29 DIAGNOSIS — M9902 Segmental and somatic dysfunction of thoracic region: Secondary | ICD-10-CM | POA: Diagnosis not present

## 2022-11-29 DIAGNOSIS — M542 Cervicalgia: Secondary | ICD-10-CM | POA: Diagnosis not present

## 2022-11-29 DIAGNOSIS — M9901 Segmental and somatic dysfunction of cervical region: Secondary | ICD-10-CM | POA: Diagnosis not present

## 2022-11-29 DIAGNOSIS — M4712 Other spondylosis with myelopathy, cervical region: Secondary | ICD-10-CM | POA: Diagnosis not present

## 2022-12-13 DIAGNOSIS — M9902 Segmental and somatic dysfunction of thoracic region: Secondary | ICD-10-CM | POA: Diagnosis not present

## 2022-12-13 DIAGNOSIS — M4712 Other spondylosis with myelopathy, cervical region: Secondary | ICD-10-CM | POA: Diagnosis not present

## 2022-12-13 DIAGNOSIS — M542 Cervicalgia: Secondary | ICD-10-CM | POA: Diagnosis not present

## 2022-12-13 DIAGNOSIS — M9901 Segmental and somatic dysfunction of cervical region: Secondary | ICD-10-CM | POA: Diagnosis not present

## 2022-12-25 ENCOUNTER — Ambulatory Visit
Admission: RE | Admit: 2022-12-25 | Discharge: 2022-12-25 | Disposition: A | Discharge status: Home / Self Care | Payer: PPO | Source: Ambulatory Visit | Attending: Family Medicine | Admitting: Family Medicine

## 2022-12-25 DIAGNOSIS — Z1231 Encounter for screening mammogram for malignant neoplasm of breast: Secondary | ICD-10-CM | POA: Diagnosis not present

## 2022-12-27 DIAGNOSIS — M542 Cervicalgia: Secondary | ICD-10-CM | POA: Diagnosis not present

## 2022-12-27 DIAGNOSIS — M9901 Segmental and somatic dysfunction of cervical region: Secondary | ICD-10-CM | POA: Diagnosis not present

## 2022-12-27 DIAGNOSIS — M4712 Other spondylosis with myelopathy, cervical region: Secondary | ICD-10-CM | POA: Diagnosis not present

## 2022-12-27 DIAGNOSIS — M9902 Segmental and somatic dysfunction of thoracic region: Secondary | ICD-10-CM | POA: Diagnosis not present

## 2023-01-10 DIAGNOSIS — M4712 Other spondylosis with myelopathy, cervical region: Secondary | ICD-10-CM | POA: Diagnosis not present

## 2023-01-10 DIAGNOSIS — M9901 Segmental and somatic dysfunction of cervical region: Secondary | ICD-10-CM | POA: Diagnosis not present

## 2023-01-10 DIAGNOSIS — M542 Cervicalgia: Secondary | ICD-10-CM | POA: Diagnosis not present

## 2023-01-10 DIAGNOSIS — M9902 Segmental and somatic dysfunction of thoracic region: Secondary | ICD-10-CM | POA: Diagnosis not present

## 2023-01-24 DIAGNOSIS — M4712 Other spondylosis with myelopathy, cervical region: Secondary | ICD-10-CM | POA: Diagnosis not present

## 2023-01-24 DIAGNOSIS — M9901 Segmental and somatic dysfunction of cervical region: Secondary | ICD-10-CM | POA: Diagnosis not present

## 2023-01-24 DIAGNOSIS — M9902 Segmental and somatic dysfunction of thoracic region: Secondary | ICD-10-CM | POA: Diagnosis not present

## 2023-01-24 DIAGNOSIS — M542 Cervicalgia: Secondary | ICD-10-CM | POA: Diagnosis not present

## 2023-02-06 DIAGNOSIS — H2513 Age-related nuclear cataract, bilateral: Secondary | ICD-10-CM | POA: Diagnosis not present

## 2023-02-07 DIAGNOSIS — M4712 Other spondylosis with myelopathy, cervical region: Secondary | ICD-10-CM | POA: Diagnosis not present

## 2023-02-07 DIAGNOSIS — M9902 Segmental and somatic dysfunction of thoracic region: Secondary | ICD-10-CM | POA: Diagnosis not present

## 2023-02-07 DIAGNOSIS — M9901 Segmental and somatic dysfunction of cervical region: Secondary | ICD-10-CM | POA: Diagnosis not present

## 2023-02-07 DIAGNOSIS — M542 Cervicalgia: Secondary | ICD-10-CM | POA: Diagnosis not present

## 2023-02-21 DIAGNOSIS — M9901 Segmental and somatic dysfunction of cervical region: Secondary | ICD-10-CM | POA: Diagnosis not present

## 2023-02-21 DIAGNOSIS — M542 Cervicalgia: Secondary | ICD-10-CM | POA: Diagnosis not present

## 2023-02-21 DIAGNOSIS — M4712 Other spondylosis with myelopathy, cervical region: Secondary | ICD-10-CM | POA: Diagnosis not present

## 2023-02-21 DIAGNOSIS — M9902 Segmental and somatic dysfunction of thoracic region: Secondary | ICD-10-CM | POA: Diagnosis not present

## 2023-02-22 ENCOUNTER — Other Ambulatory Visit: Payer: Self-pay | Admitting: Family Medicine

## 2023-02-22 DIAGNOSIS — I10 Essential (primary) hypertension: Secondary | ICD-10-CM

## 2023-04-04 DIAGNOSIS — M9902 Segmental and somatic dysfunction of thoracic region: Secondary | ICD-10-CM | POA: Diagnosis not present

## 2023-04-04 DIAGNOSIS — M542 Cervicalgia: Secondary | ICD-10-CM | POA: Diagnosis not present

## 2023-04-04 DIAGNOSIS — M4712 Other spondylosis with myelopathy, cervical region: Secondary | ICD-10-CM | POA: Diagnosis not present

## 2023-04-04 DIAGNOSIS — M9901 Segmental and somatic dysfunction of cervical region: Secondary | ICD-10-CM | POA: Diagnosis not present

## 2023-04-06 ENCOUNTER — Other Ambulatory Visit: Payer: Self-pay | Admitting: Family Medicine

## 2023-04-07 NOTE — Assessment & Plan Note (Signed)
Well controlled.  No changes to medicines. Lisinopril 10 mg daily. Continue to work on eating a healthy diet and exercise.  Labs drawn today.  

## 2023-04-07 NOTE — Assessment & Plan Note (Signed)
Recommend continue to work on eating healthy diet and exercise. Recheck in 6 months fasting.  

## 2023-04-07 NOTE — Assessment & Plan Note (Signed)
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
## 2023-04-08 ENCOUNTER — Encounter: Payer: Self-pay | Admitting: Family Medicine

## 2023-04-08 ENCOUNTER — Ambulatory Visit (INDEPENDENT_AMBULATORY_CARE_PROVIDER_SITE_OTHER): Payer: PPO | Admitting: Family Medicine

## 2023-04-08 VITALS — BP 136/80 | HR 78 | Temp 97.6°F | Ht 63.5 in | Wt 208.0 lb

## 2023-04-08 DIAGNOSIS — Z1159 Encounter for screening for other viral diseases: Secondary | ICD-10-CM

## 2023-04-08 DIAGNOSIS — E782 Mixed hyperlipidemia: Secondary | ICD-10-CM

## 2023-04-08 DIAGNOSIS — Z0000 Encounter for general adult medical examination without abnormal findings: Secondary | ICD-10-CM | POA: Diagnosis not present

## 2023-04-08 DIAGNOSIS — I10 Essential (primary) hypertension: Secondary | ICD-10-CM | POA: Diagnosis not present

## 2023-04-08 DIAGNOSIS — Z114 Encounter for screening for human immunodeficiency virus [HIV]: Secondary | ICD-10-CM | POA: Diagnosis not present

## 2023-04-08 LAB — CBC WITH DIFFERENTIAL/PLATELET
Basophils Absolute: 0.1 10*3/uL (ref 0.0–0.2)
Basos: 1 %
EOS (ABSOLUTE): 0.1 10*3/uL (ref 0.0–0.4)
Eos: 2 %
Hematocrit: 44.6 % (ref 34.0–46.6)
Hemoglobin: 14.8 g/dL (ref 11.1–15.9)
Immature Grans (Abs): 0 10*3/uL (ref 0.0–0.1)
Immature Granulocytes: 0 %
Lymphocytes Absolute: 1.5 10*3/uL (ref 0.7–3.1)
Lymphs: 31 %
MCH: 29.2 pg (ref 26.6–33.0)
MCHC: 33.2 g/dL (ref 31.5–35.7)
MCV: 88 fL (ref 79–97)
Monocytes Absolute: 0.6 10*3/uL (ref 0.1–0.9)
Monocytes: 13 %
Neutrophils Absolute: 2.6 10*3/uL (ref 1.4–7.0)
Neutrophils: 53 %
Platelets: 308 10*3/uL (ref 150–450)
RBC: 5.07 x10E6/uL (ref 3.77–5.28)
RDW: 12.2 % (ref 11.7–15.4)
WBC: 4.8 10*3/uL (ref 3.4–10.8)

## 2023-04-08 LAB — COMPREHENSIVE METABOLIC PANEL
ALT: 24 IU/L (ref 0–32)
AST: 26 IU/L (ref 0–40)
Albumin: 4.6 g/dL (ref 3.9–4.9)
Alkaline Phosphatase: 114 IU/L (ref 44–121)
BUN/Creatinine Ratio: 20 (ref 12–28)
BUN: 15 mg/dL (ref 8–27)
Bilirubin Total: 0.4 mg/dL (ref 0.0–1.2)
CO2: 27 mmol/L (ref 20–29)
Calcium: 9.6 mg/dL (ref 8.7–10.3)
Chloride: 101 mmol/L (ref 96–106)
Creatinine, Ser: 0.74 mg/dL (ref 0.57–1.00)
Globulin, Total: 2.8 g/dL (ref 1.5–4.5)
Glucose: 79 mg/dL (ref 70–99)
Potassium: 4.7 mmol/L (ref 3.5–5.2)
Sodium: 143 mmol/L (ref 134–144)
Total Protein: 7.4 g/dL (ref 6.0–8.5)
eGFR: 91 mL/min/{1.73_m2} (ref >59)

## 2023-04-08 LAB — LIPID PANEL
Chol/HDL Ratio: 3 ratio (ref 0.0–4.4)
Cholesterol, Total: 210 mg/dL — ABNORMAL HIGH (ref 100–199)
HDL: 71 mg/dL (ref >39)
LDL Chol Calc (NIH): 119 mg/dL — ABNORMAL HIGH (ref 0–99)
Triglycerides: 114 mg/dL (ref 0–149)
VLDL Cholesterol Cal: 20 mg/dL (ref 5–40)

## 2023-04-08 NOTE — Patient Instructions (Signed)
Recommend tetanus (TDAP) and shingles series at your pharmacy.   Things to do to keep yourself healthy  - Exercise at least 30-45 minutes a day, 3-4 days a week.  - Eat a low-fat diet with lots of fruits and vegetables, up to 7-9 servings per day.  - Seatbelts can save your life. Wear them always.  - Smoke detectors on every level of your home, check batteries every year.  - Eye Doctor - have an eye exam every 1-2 years  - Safe sex - if you may be exposed to STDs, use a condom.  - Alcohol -  If you drink, do it moderately, less than 2 drinks per day.  - Health Care Power of Attorney. Choose someone to speak for you if you are not able.  - Depression is common in our stressful world.If you're feeling down or losing interest in things you normally enjoy, please come in for a visit.  - Violence - If anyone is threatening or hurting you, please call immediately.

## 2023-04-09 LAB — HCV INTERPRETATION

## 2023-04-09 LAB — HCV AB W REFLEX TO QUANT PCR: HCV Ab: NONREACTIVE

## 2023-04-09 LAB — HIV ANTIBODY (ROUTINE TESTING W REFLEX): HIV Screen 4th Generation wRfx: NONREACTIVE

## 2023-04-25 DIAGNOSIS — M9901 Segmental and somatic dysfunction of cervical region: Secondary | ICD-10-CM | POA: Diagnosis not present

## 2023-04-25 DIAGNOSIS — M4712 Other spondylosis with myelopathy, cervical region: Secondary | ICD-10-CM | POA: Diagnosis not present

## 2023-04-25 DIAGNOSIS — M9902 Segmental and somatic dysfunction of thoracic region: Secondary | ICD-10-CM | POA: Diagnosis not present

## 2023-04-25 DIAGNOSIS — M542 Cervicalgia: Secondary | ICD-10-CM | POA: Diagnosis not present

## 2023-04-30 DIAGNOSIS — L578 Other skin changes due to chronic exposure to nonionizing radiation: Secondary | ICD-10-CM | POA: Diagnosis not present

## 2023-04-30 DIAGNOSIS — L814 Other melanin hyperpigmentation: Secondary | ICD-10-CM | POA: Diagnosis not present

## 2023-05-09 DIAGNOSIS — M9902 Segmental and somatic dysfunction of thoracic region: Secondary | ICD-10-CM | POA: Diagnosis not present

## 2023-05-09 DIAGNOSIS — M4712 Other spondylosis with myelopathy, cervical region: Secondary | ICD-10-CM | POA: Diagnosis not present

## 2023-05-09 DIAGNOSIS — M9901 Segmental and somatic dysfunction of cervical region: Secondary | ICD-10-CM | POA: Diagnosis not present

## 2023-05-09 DIAGNOSIS — M542 Cervicalgia: Secondary | ICD-10-CM | POA: Diagnosis not present

## 2023-05-21 ENCOUNTER — Other Ambulatory Visit: Payer: Self-pay | Admitting: Family Medicine

## 2023-05-21 DIAGNOSIS — I10 Essential (primary) hypertension: Secondary | ICD-10-CM

## 2023-05-30 DIAGNOSIS — M9902 Segmental and somatic dysfunction of thoracic region: Secondary | ICD-10-CM | POA: Diagnosis not present

## 2023-05-30 DIAGNOSIS — M542 Cervicalgia: Secondary | ICD-10-CM | POA: Diagnosis not present

## 2023-05-30 DIAGNOSIS — M9901 Segmental and somatic dysfunction of cervical region: Secondary | ICD-10-CM | POA: Diagnosis not present

## 2023-05-30 DIAGNOSIS — M4712 Other spondylosis with myelopathy, cervical region: Secondary | ICD-10-CM | POA: Diagnosis not present

## 2023-06-13 DIAGNOSIS — M9901 Segmental and somatic dysfunction of cervical region: Secondary | ICD-10-CM | POA: Diagnosis not present

## 2023-06-13 DIAGNOSIS — M4712 Other spondylosis with myelopathy, cervical region: Secondary | ICD-10-CM | POA: Diagnosis not present

## 2023-06-13 DIAGNOSIS — M542 Cervicalgia: Secondary | ICD-10-CM | POA: Diagnosis not present

## 2023-06-13 DIAGNOSIS — M9902 Segmental and somatic dysfunction of thoracic region: Secondary | ICD-10-CM | POA: Diagnosis not present

## 2023-06-21 ENCOUNTER — Ambulatory Visit (INDEPENDENT_AMBULATORY_CARE_PROVIDER_SITE_OTHER): Payer: PPO

## 2023-06-21 DIAGNOSIS — Z23 Encounter for immunization: Secondary | ICD-10-CM | POA: Diagnosis not present

## 2023-07-04 DIAGNOSIS — M9901 Segmental and somatic dysfunction of cervical region: Secondary | ICD-10-CM | POA: Diagnosis not present

## 2023-07-04 DIAGNOSIS — M4712 Other spondylosis with myelopathy, cervical region: Secondary | ICD-10-CM | POA: Diagnosis not present

## 2023-07-04 DIAGNOSIS — M9902 Segmental and somatic dysfunction of thoracic region: Secondary | ICD-10-CM | POA: Diagnosis not present

## 2023-07-04 DIAGNOSIS — M542 Cervicalgia: Secondary | ICD-10-CM | POA: Diagnosis not present

## 2023-07-18 DIAGNOSIS — M9902 Segmental and somatic dysfunction of thoracic region: Secondary | ICD-10-CM | POA: Diagnosis not present

## 2023-07-18 DIAGNOSIS — M4712 Other spondylosis with myelopathy, cervical region: Secondary | ICD-10-CM | POA: Diagnosis not present

## 2023-07-18 DIAGNOSIS — M9901 Segmental and somatic dysfunction of cervical region: Secondary | ICD-10-CM | POA: Diagnosis not present

## 2023-07-18 DIAGNOSIS — M542 Cervicalgia: Secondary | ICD-10-CM | POA: Diagnosis not present

## 2023-08-01 DIAGNOSIS — M4712 Other spondylosis with myelopathy, cervical region: Secondary | ICD-10-CM | POA: Diagnosis not present

## 2023-08-01 DIAGNOSIS — M9902 Segmental and somatic dysfunction of thoracic region: Secondary | ICD-10-CM | POA: Diagnosis not present

## 2023-08-01 DIAGNOSIS — M542 Cervicalgia: Secondary | ICD-10-CM | POA: Diagnosis not present

## 2023-08-01 DIAGNOSIS — M9901 Segmental and somatic dysfunction of cervical region: Secondary | ICD-10-CM | POA: Diagnosis not present

## 2023-08-22 DIAGNOSIS — M9902 Segmental and somatic dysfunction of thoracic region: Secondary | ICD-10-CM | POA: Diagnosis not present

## 2023-08-22 DIAGNOSIS — M4712 Other spondylosis with myelopathy, cervical region: Secondary | ICD-10-CM | POA: Diagnosis not present

## 2023-08-22 DIAGNOSIS — M542 Cervicalgia: Secondary | ICD-10-CM | POA: Diagnosis not present

## 2023-08-22 DIAGNOSIS — M9901 Segmental and somatic dysfunction of cervical region: Secondary | ICD-10-CM | POA: Diagnosis not present

## 2023-09-05 DIAGNOSIS — M9902 Segmental and somatic dysfunction of thoracic region: Secondary | ICD-10-CM | POA: Diagnosis not present

## 2023-09-05 DIAGNOSIS — M4712 Other spondylosis with myelopathy, cervical region: Secondary | ICD-10-CM | POA: Diagnosis not present

## 2023-09-05 DIAGNOSIS — M9901 Segmental and somatic dysfunction of cervical region: Secondary | ICD-10-CM | POA: Diagnosis not present

## 2023-09-05 DIAGNOSIS — M542 Cervicalgia: Secondary | ICD-10-CM | POA: Diagnosis not present

## 2023-09-19 DIAGNOSIS — M9902 Segmental and somatic dysfunction of thoracic region: Secondary | ICD-10-CM | POA: Diagnosis not present

## 2023-09-19 DIAGNOSIS — M542 Cervicalgia: Secondary | ICD-10-CM | POA: Diagnosis not present

## 2023-09-19 DIAGNOSIS — M4712 Other spondylosis with myelopathy, cervical region: Secondary | ICD-10-CM | POA: Diagnosis not present

## 2023-09-19 DIAGNOSIS — M9901 Segmental and somatic dysfunction of cervical region: Secondary | ICD-10-CM | POA: Diagnosis not present

## 2023-10-01 DIAGNOSIS — M9901 Segmental and somatic dysfunction of cervical region: Secondary | ICD-10-CM | POA: Diagnosis not present

## 2023-10-01 DIAGNOSIS — M4712 Other spondylosis with myelopathy, cervical region: Secondary | ICD-10-CM | POA: Diagnosis not present

## 2023-10-01 DIAGNOSIS — M9902 Segmental and somatic dysfunction of thoracic region: Secondary | ICD-10-CM | POA: Diagnosis not present

## 2023-10-01 DIAGNOSIS — M542 Cervicalgia: Secondary | ICD-10-CM | POA: Diagnosis not present
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
## 2023-10-09 ENCOUNTER — Ambulatory Visit (INDEPENDENT_AMBULATORY_CARE_PROVIDER_SITE_OTHER): Payer: PPO | Admitting: Family Medicine

## 2023-10-09 ENCOUNTER — Encounter: Payer: Self-pay | Admitting: Family Medicine

## 2023-10-09 VITALS — BP 130/86 | HR 78 | Temp 97.6°F | Ht 63.5 in | Wt 206.0 lb

## 2023-10-09 DIAGNOSIS — I10 Essential (primary) hypertension: Secondary | ICD-10-CM

## 2023-10-09 DIAGNOSIS — M4150 Other secondary scoliosis, site unspecified: Secondary | ICD-10-CM

## 2023-10-09 DIAGNOSIS — R09A2 Foreign body sensation, throat: Secondary | ICD-10-CM | POA: Diagnosis not present

## 2023-10-09 DIAGNOSIS — E782 Mixed hyperlipidemia: Secondary | ICD-10-CM

## 2023-10-09 NOTE — Assessment & Plan Note (Signed)
 Not truly dysphagia. She feels like something is stuck.

## 2023-10-09 NOTE — Assessment & Plan Note (Signed)
Well controlled.  No changes to medicines. Lisinopril 10 mg daily. Continue to work on eating a healthy diet and exercise.  Labs drawn today.  

## 2023-10-09 NOTE — Assessment & Plan Note (Addendum)
 Await labs/testing for assessment and recommendations. Recommend continue to work on eating healthy diet and exercise. Recheck in 3 months fasting.

## 2023-10-10 LAB — CBC WITH DIFFERENTIAL/PLATELET
Basophils Absolute: 0.1 10*3/uL (ref 0.0–0.2)
Basos: 1 %
EOS (ABSOLUTE): 0.1 10*3/uL (ref 0.0–0.4)
Eos: 1 %
Hematocrit: 45.9 % (ref 34.0–46.6)
Hemoglobin: 14.9 g/dL (ref 11.1–15.9)
Immature Grans (Abs): 0 10*3/uL (ref 0.0–0.1)
Immature Granulocytes: 0 %
Lymphocytes Absolute: 1.5 10*3/uL (ref 0.7–3.1)
Lymphs: 23 %
MCH: 29.2 pg (ref 26.6–33.0)
MCHC: 32.5 g/dL (ref 31.5–35.7)
MCV: 90 fL (ref 79–97)
Monocytes Absolute: 0.6 10*3/uL (ref 0.1–0.9)
Monocytes: 9 %
Neutrophils Absolute: 4.2 10*3/uL (ref 1.4–7.0)
Neutrophils: 66 %
Platelets: 311 10*3/uL (ref 150–450)
RBC: 5.1 x10E6/uL (ref 3.77–5.28)
RDW: 12 % (ref 11.7–15.4)
WBC: 6.4 10*3/uL (ref 3.4–10.8)

## 2023-10-10 LAB — COMPREHENSIVE METABOLIC PANEL
ALT: 20 IU/L (ref 0–32)
AST: 21 IU/L (ref 0–40)
Albumin: 4.4 g/dL (ref 3.9–4.9)
Alkaline Phosphatase: 124 IU/L — ABNORMAL HIGH (ref 44–121)
BUN/Creatinine Ratio: 19 (ref 12–28)
BUN: 14 mg/dL (ref 8–27)
Bilirubin Total: 0.4 mg/dL (ref 0.0–1.2)
CO2: 26 mmol/L (ref 20–29)
Calcium: 9.7 mg/dL (ref 8.7–10.3)
Chloride: 101 mmol/L (ref 96–106)
Creatinine, Ser: 0.72 mg/dL (ref 0.57–1.00)
Globulin, Total: 2.6 g/dL (ref 1.5–4.5)
Glucose: 89 mg/dL (ref 70–99)
Potassium: 5 mmol/L (ref 3.5–5.2)
Sodium: 141 mmol/L (ref 134–144)
Total Protein: 7 g/dL (ref 6.0–8.5)
eGFR: 94 mL/min/{1.73_m2} (ref >59)

## 2023-10-10 LAB — LIPID PANEL
Chol/HDL Ratio: 3 ratio (ref 0.0–4.4)
Cholesterol, Total: 218 mg/dL — ABNORMAL HIGH (ref 100–199)
HDL: 72 mg/dL (ref >39)
LDL Chol Calc (NIH): 128 mg/dL — ABNORMAL HIGH (ref 0–99)
Triglycerides: 101 mg/dL (ref 0–149)
VLDL Cholesterol Cal: 18 mg/dL (ref 5–40)

## 2023-10-11 ENCOUNTER — Other Ambulatory Visit: Payer: Self-pay

## 2023-10-11 DIAGNOSIS — E782 Mixed hyperlipidemia: Secondary | ICD-10-CM

## 2023-10-11 MED ORDER — ATORVASTATIN CALCIUM 10 MG PO TABS: 10.0000 mg | ORAL_TABLET | Freq: Every day | ORAL | 3 refills | Status: AC

## 2023-10-11 NOTE — Progress Notes (Signed)
 Order sent in for Lipitor 10 mg.

## 2023-10-13 NOTE — Assessment & Plan Note (Signed)
 Continue gabapentin.

## 2023-10-15 DIAGNOSIS — M9902 Segmental and somatic dysfunction of thoracic region: Secondary | ICD-10-CM | POA: Diagnosis not present

## 2023-10-15 DIAGNOSIS — M4712 Other spondylosis with myelopathy, cervical region: Secondary | ICD-10-CM | POA: Diagnosis not present

## 2023-10-15 DIAGNOSIS — M542 Cervicalgia: Secondary | ICD-10-CM | POA: Diagnosis not present

## 2023-10-15 DIAGNOSIS — M9901 Segmental and somatic dysfunction of cervical region: Secondary | ICD-10-CM | POA: Diagnosis not present

## 2023-10-21 ENCOUNTER — Other Ambulatory Visit: Payer: Self-pay | Admitting: Family Medicine

## 2023-11-05 DIAGNOSIS — M9901 Segmental and somatic dysfunction of cervical region: Secondary | ICD-10-CM | POA: Diagnosis not present

## 2023-11-05 DIAGNOSIS — M9902 Segmental and somatic dysfunction of thoracic region: Secondary | ICD-10-CM | POA: Diagnosis not present

## 2023-11-05 DIAGNOSIS — M542 Cervicalgia: Secondary | ICD-10-CM | POA: Diagnosis not present

## 2023-11-05 DIAGNOSIS — M4712 Other spondylosis with myelopathy, cervical region: Secondary | ICD-10-CM | POA: Diagnosis not present

## 2023-11-18 ENCOUNTER — Other Ambulatory Visit: Payer: Self-pay | Admitting: Family Medicine

## 2023-11-18 DIAGNOSIS — Z0000 Encounter for general adult medical examination without abnormal findings: Secondary | ICD-10-CM

## 2023-11-21 DIAGNOSIS — K219 Gastro-esophageal reflux disease without esophagitis: Secondary | ICD-10-CM | POA: Diagnosis not present

## 2023-11-21 DIAGNOSIS — R1310 Dysphagia, unspecified: Secondary | ICD-10-CM | POA: Diagnosis not present

## 2023-11-26 DIAGNOSIS — M9901 Segmental and somatic dysfunction of cervical region: Secondary | ICD-10-CM | POA: Diagnosis not present

## 2023-11-26 DIAGNOSIS — M4712 Other spondylosis with myelopathy, cervical region: Secondary | ICD-10-CM | POA: Diagnosis not present

## 2023-11-26 DIAGNOSIS — M542 Cervicalgia: Secondary | ICD-10-CM | POA: Diagnosis not present

## 2023-11-26 DIAGNOSIS — M9902 Segmental and somatic dysfunction of thoracic region: Secondary | ICD-10-CM | POA: Diagnosis not present

## 2023-12-10 DIAGNOSIS — M542 Cervicalgia: Secondary | ICD-10-CM | POA: Diagnosis not present

## 2023-12-10 DIAGNOSIS — M4712 Other spondylosis with myelopathy, cervical region: Secondary | ICD-10-CM | POA: Diagnosis not present

## 2023-12-10 DIAGNOSIS — M9901 Segmental and somatic dysfunction of cervical region: Secondary | ICD-10-CM | POA: Diagnosis not present

## 2023-12-10 DIAGNOSIS — M9902 Segmental and somatic dysfunction of thoracic region: Secondary | ICD-10-CM | POA: Diagnosis not present

## 2023-12-24 DIAGNOSIS — M9901 Segmental and somatic dysfunction of cervical region: Secondary | ICD-10-CM | POA: Diagnosis not present

## 2023-12-24 DIAGNOSIS — M542 Cervicalgia: Secondary | ICD-10-CM | POA: Diagnosis not present

## 2023-12-24 DIAGNOSIS — M9902 Segmental and somatic dysfunction of thoracic region: Secondary | ICD-10-CM | POA: Diagnosis not present

## 2023-12-24 DIAGNOSIS — M4712 Other spondylosis with myelopathy, cervical region: Secondary | ICD-10-CM | POA: Diagnosis not present

## 2023-12-26 ENCOUNTER — Ambulatory Visit
Admission: RE | Admit: 2023-12-26 | Discharge: 2023-12-26 | Disposition: A | Discharge status: Home / Self Care | Source: Ambulatory Visit | Attending: Family Medicine | Admitting: Family Medicine

## 2023-12-26 DIAGNOSIS — Z1231 Encounter for screening mammogram for malignant neoplasm of breast: Secondary | ICD-10-CM | POA: Diagnosis not present

## 2023-12-26 DIAGNOSIS — Z0000 Encounter for general adult medical examination without abnormal findings: Secondary | ICD-10-CM

## 2023-12-31 ENCOUNTER — Ambulatory Visit: Payer: Self-pay | Admitting: Family Medicine

## 2023-12-31 DIAGNOSIS — I10 Essential (primary) hypertension: Secondary | ICD-10-CM | POA: Diagnosis not present

## 2023-12-31 DIAGNOSIS — R1310 Dysphagia, unspecified: Secondary | ICD-10-CM | POA: Diagnosis not present

## 2023-12-31 DIAGNOSIS — K222 Esophageal obstruction: Secondary | ICD-10-CM | POA: Diagnosis not present

## 2023-12-31 DIAGNOSIS — K219 Gastro-esophageal reflux disease without esophagitis: Secondary | ICD-10-CM | POA: Diagnosis not present

## 2023-12-31 DIAGNOSIS — K208 Other esophagitis without bleeding: Secondary | ICD-10-CM | POA: Diagnosis not present

## 2023-12-31 DIAGNOSIS — K449 Diaphragmatic hernia without obstruction or gangrene: Secondary | ICD-10-CM | POA: Diagnosis not present

## 2024-01-07 DIAGNOSIS — M542 Cervicalgia: Secondary | ICD-10-CM | POA: Diagnosis not present

## 2024-01-07 DIAGNOSIS — M9901 Segmental and somatic dysfunction of cervical region: Secondary | ICD-10-CM | POA: Diagnosis not present

## 2024-01-07 DIAGNOSIS — M4712 Other spondylosis with myelopathy, cervical region: Secondary | ICD-10-CM | POA: Diagnosis not present

## 2024-01-07 DIAGNOSIS — M9902 Segmental and somatic dysfunction of thoracic region: Secondary | ICD-10-CM | POA: Diagnosis not present

## 2024-02-04 DIAGNOSIS — M9902 Segmental and somatic dysfunction of thoracic region: Secondary | ICD-10-CM | POA: Diagnosis not present

## 2024-02-04 DIAGNOSIS — M542 Cervicalgia: Secondary | ICD-10-CM | POA: Diagnosis not present

## 2024-02-04 DIAGNOSIS — M4712 Other spondylosis with myelopathy, cervical region: Secondary | ICD-10-CM | POA: Diagnosis not present

## 2024-02-04 DIAGNOSIS — M9901 Segmental and somatic dysfunction of cervical region: Secondary | ICD-10-CM | POA: Diagnosis not present

## 2024-02-06 ENCOUNTER — Encounter: Payer: Self-pay | Admitting: Family Medicine

## 2024-02-06 ENCOUNTER — Ambulatory Visit: Payer: PPO | Admitting: Family Medicine

## 2024-02-06 DIAGNOSIS — I10 Essential (primary) hypertension: Secondary | ICD-10-CM | POA: Diagnosis not present

## 2024-02-06 DIAGNOSIS — E782 Mixed hyperlipidemia: Secondary | ICD-10-CM

## 2024-02-06 LAB — COMPREHENSIVE METABOLIC PANEL WITH GFR
ALT: 23 IU/L (ref 0–32)
AST: 26 IU/L (ref 0–40)
Albumin: 4.5 g/dL (ref 3.9–4.9)
Alkaline Phosphatase: 119 IU/L (ref 44–121)
BUN/Creatinine Ratio: 18 (ref 12–28)
BUN: 12 mg/dL (ref 8–27)
Bilirubin Total: 0.4 mg/dL (ref 0.0–1.2)
CO2: 25 mmol/L (ref 20–29)
Calcium: 9.6 mg/dL (ref 8.7–10.3)
Chloride: 100 mmol/L (ref 96–106)
Creatinine, Ser: 0.68 mg/dL (ref 0.57–1.00)
Globulin, Total: 2.5 g/dL (ref 1.5–4.5)
Glucose: 81 mg/dL (ref 70–99)
Potassium: 4.2 mmol/L (ref 3.5–5.2)
Sodium: 142 mmol/L (ref 134–144)
Total Protein: 7 g/dL (ref 6.0–8.5)
eGFR: 97 mL/min/{1.73_m2} (ref >59)

## 2024-02-06 LAB — LIPID PANEL
Chol/HDL Ratio: 2.3 ratio (ref 0.0–4.4)
Cholesterol, Total: 165 mg/dL (ref 100–199)
HDL: 72 mg/dL (ref >39)
LDL Chol Calc (NIH): 76 mg/dL (ref 0–99)
Triglycerides: 92 mg/dL (ref 0–149)
VLDL Cholesterol Cal: 17 mg/dL (ref 5–40)

## 2024-02-06 MED ORDER — GABAPENTIN 300 MG PO CAPS: 300.0000 mg | ORAL_CAPSULE | Freq: Three times a day (TID) | ORAL | 2 refills | Status: AC

## 2024-02-06 MED ORDER — LISINOPRIL 10 MG PO TABS
10.0000 mg | ORAL_TABLET | Freq: Every day | ORAL | 1 refills | Status: DC
Start: 2024-02-06 — End: 2024-05-11

## 2024-02-06 NOTE — Assessment & Plan Note (Signed)
 Recommend continue to work on eating healthy diet and exercise. Comorbidties: hyperlipidemia

## 2024-02-06 NOTE — Assessment & Plan Note (Addendum)
 Await labs/testing for assessment and recommendations. CONTINUE LIPITOR Recommend continue to work on eating healthy diet and exercise. Recheck in 3 months fasting.

## 2024-02-06 NOTE — Assessment & Plan Note (Signed)
Well controlled.  No changes to medicines. Lisinopril 10 mg daily. Continue to work on eating a healthy diet and exercise.  Labs drawn today.  

## 2024-02-06 NOTE — Patient Instructions (Signed)
 Recommend you get tetanus and shingles vaccines at your pharmacy.
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
## 2024-02-07 ENCOUNTER — Ambulatory Visit: Payer: Self-pay | Admitting: Family Medicine

## 2024-02-25 DIAGNOSIS — M542 Cervicalgia: Secondary | ICD-10-CM | POA: Diagnosis not present

## 2024-02-25 DIAGNOSIS — Z124 Encounter for screening for malignant neoplasm of cervix: Secondary | ICD-10-CM | POA: Diagnosis not present

## 2024-02-25 DIAGNOSIS — Z01419 Encounter for gynecological examination (general) (routine) without abnormal findings: Secondary | ICD-10-CM | POA: Diagnosis not present

## 2024-02-25 DIAGNOSIS — M9902 Segmental and somatic dysfunction of thoracic region: Secondary | ICD-10-CM | POA: Diagnosis not present

## 2024-02-25 DIAGNOSIS — M4712 Other spondylosis with myelopathy, cervical region: Secondary | ICD-10-CM | POA: Diagnosis not present

## 2024-02-25 DIAGNOSIS — M9901 Segmental and somatic dysfunction of cervical region: Secondary | ICD-10-CM | POA: Diagnosis not present

## 2024-03-02 ENCOUNTER — Encounter: Payer: Self-pay | Admitting: Obstetrics & Gynecology

## 2024-03-02 ENCOUNTER — Ambulatory Visit: Payer: Self-pay | Admitting: Family Medicine

## 2024-03-02 LAB — HM PAP SMEAR: HPV, high-risk: NEGATIVE

## 2024-03-03 DIAGNOSIS — K219 Gastro-esophageal reflux disease without esophagitis: Secondary | ICD-10-CM | POA: Diagnosis not present

## 2024-03-03 DIAGNOSIS — R1310 Dysphagia, unspecified: Secondary | ICD-10-CM | POA: Diagnosis not present

## 2024-03-31 DIAGNOSIS — M9901 Segmental and somatic dysfunction of cervical region: Secondary | ICD-10-CM | POA: Diagnosis not present

## 2024-03-31 DIAGNOSIS — M4712 Other spondylosis with myelopathy, cervical region: Secondary | ICD-10-CM | POA: Diagnosis not present

## 2024-03-31 DIAGNOSIS — M9902 Segmental and somatic dysfunction of thoracic region: Secondary | ICD-10-CM | POA: Diagnosis not present

## 2024-03-31 DIAGNOSIS — M542 Cervicalgia: Secondary | ICD-10-CM | POA: Diagnosis not present

## 2024-04-14 DIAGNOSIS — M9901 Segmental and somatic dysfunction of cervical region: Secondary | ICD-10-CM | POA: Diagnosis not present

## 2024-04-14 DIAGNOSIS — M542 Cervicalgia: Secondary | ICD-10-CM | POA: Diagnosis not present

## 2024-04-14 DIAGNOSIS — M9902 Segmental and somatic dysfunction of thoracic region: Secondary | ICD-10-CM | POA: Diagnosis not present

## 2024-04-14 DIAGNOSIS — M4712 Other spondylosis with myelopathy, cervical region: Secondary | ICD-10-CM | POA: Diagnosis not present

## 2024-04-16 ENCOUNTER — Encounter: Payer: Self-pay | Admitting: Family Medicine

## 2024-04-16 ENCOUNTER — Ambulatory Visit: Admitting: Family Medicine

## 2024-04-16 DIAGNOSIS — Z13820 Encounter for screening for osteoporosis: Secondary | ICD-10-CM

## 2024-04-16 DIAGNOSIS — Z0000 Encounter for general adult medical examination without abnormal findings: Secondary | ICD-10-CM | POA: Diagnosis not present

## 2024-04-16 NOTE — Assessment & Plan Note (Signed)
 Up to date on screenings - ordered Dexa scan - Needs shingles and tdap vaccine - advised to get at pharmacy   Things to do to keep yourself healthy  - Exercise at least 30-45 minutes a day, 3-4 days a week.  - Eat a low-fat diet with lots of fruits and vegetables, up to 7-9 servings per day.  - Seatbelts can save your life. Wear them always.  - Smoke detectors on every level of your home, check batteries every year.  - Eye Doctor - have an eye exam every 1-2 years  - Safe sex - if you may be exposed to STDs, use a condom.  - Alcohol -  If you drink, do it moderately, less than 2 drinks per day.  - Health Care Power of Attorney. Choose someone to speak for you if you are not able.  - Depression is common in our stressful world.If you're feeling down or losing interest in things you normally enjoy, please come in for a visit.  - Violence - If anyone is threatening or hurting you, please call immediately.
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
## 2024-05-05 ENCOUNTER — Ambulatory Visit: Payer: Self-pay | Admitting: Family Medicine

## 2024-05-05 ENCOUNTER — Ambulatory Visit (INDEPENDENT_AMBULATORY_CARE_PROVIDER_SITE_OTHER)
Admission: RE | Admit: 2024-05-05 | Discharge: 2024-05-05 | Disposition: A | Discharge status: Home / Self Care | Source: Ambulatory Visit | Attending: Family Medicine | Admitting: Family Medicine

## 2024-05-05 DIAGNOSIS — M542 Cervicalgia: Secondary | ICD-10-CM | POA: Diagnosis not present

## 2024-05-05 DIAGNOSIS — Z13820 Encounter for screening for osteoporosis: Secondary | ICD-10-CM | POA: Diagnosis not present

## 2024-05-05 DIAGNOSIS — Z780 Asymptomatic menopausal state: Secondary | ICD-10-CM

## 2024-05-05 DIAGNOSIS — M9902 Segmental and somatic dysfunction of thoracic region: Secondary | ICD-10-CM | POA: Diagnosis not present

## 2024-05-05 DIAGNOSIS — M9901 Segmental and somatic dysfunction of cervical region: Secondary | ICD-10-CM | POA: Diagnosis not present

## 2024-05-05 DIAGNOSIS — M4712 Other spondylosis with myelopathy, cervical region: Secondary | ICD-10-CM | POA: Diagnosis not present

## 2024-05-05 DIAGNOSIS — M8589 Other specified disorders of bone density and structure, multiple sites: Secondary | ICD-10-CM | POA: Diagnosis not present

## 2024-05-11 ENCOUNTER — Other Ambulatory Visit: Payer: Self-pay | Admitting: Family Medicine

## 2024-05-11 DIAGNOSIS — I10 Essential (primary) hypertension: Secondary | ICD-10-CM

## 2024-05-11 MED ORDER — LISINOPRIL 10 MG PO TABS: 10.0000 mg | ORAL_TABLET | Freq: Every day | ORAL | 1 refills | Status: AC

## 2024-05-11 NOTE — Telephone Encounter (Signed)
 Copied from CRM [id number]. Topic: Clinical - Medication Refill >> May 11, 2024  9:56 AM Jasmin G wrote: Medication: lisinopril  (ZESTRIL ) 10 MG tablet. Pt states that she will go out of town soon and is placing refill early.  Has the patient contacted their pharmacy? No (Agent: If no, request that the patient contact the pharmacy for the refill. If patient does not wish to contact the pharmacy document the reason why and proceed with request.) (Agent: If yes, when and what did the pharmacy advise?)  This is the patient's preferred pharmacy:  [facility] [address] [place] - [address] [place] [address] Phone: [phone] Fax: [phone]  Is this the correct pharmacy for this prescription? Yes If no, delete pharmacy and type the correct one.   Has the prescription been filled recently? Yes  Is the patient out of the medication? No  Has the patient been seen for an appointment in the last year OR does the patient have an upcoming appointment? Yes  Can we respond through MyChart? No  Agent: Please be advised that Rx refills may take up to 3 business days. We ask that you follow-up with your pharmacy.

## 2024-05-19 DIAGNOSIS — M542 Cervicalgia: Secondary | ICD-10-CM | POA: Diagnosis not present

## 2024-05-19 DIAGNOSIS — M4712 Other spondylosis with myelopathy, cervical region: Secondary | ICD-10-CM | POA: Diagnosis not present

## 2024-05-19 DIAGNOSIS — M9901 Segmental and somatic dysfunction of cervical region: Secondary | ICD-10-CM | POA: Diagnosis not present

## 2024-05-19 DIAGNOSIS — M9902 Segmental and somatic dysfunction of thoracic region: Secondary | ICD-10-CM | POA: Diagnosis not present

## 2024-06-02 DIAGNOSIS — M9905 Segmental and somatic dysfunction of pelvic region: Secondary | ICD-10-CM | POA: Diagnosis not present

## 2024-06-02 DIAGNOSIS — M9901 Segmental and somatic dysfunction of cervical region: Secondary | ICD-10-CM | POA: Diagnosis not present

## 2024-06-02 DIAGNOSIS — M9902 Segmental and somatic dysfunction of thoracic region: Secondary | ICD-10-CM | POA: Diagnosis not present

## 2024-06-02 DIAGNOSIS — M9903 Segmental and somatic dysfunction of lumbar region: Secondary | ICD-10-CM | POA: Diagnosis not present

## 2024-06-02 DIAGNOSIS — M4712 Other spondylosis with myelopathy, cervical region: Secondary | ICD-10-CM | POA: Diagnosis not present

## 2024-06-02 DIAGNOSIS — M542 Cervicalgia: Secondary | ICD-10-CM | POA: Diagnosis not present

## 2024-06-04 ENCOUNTER — Encounter: Payer: Self-pay | Admitting: Family Medicine

## 2024-06-04 ENCOUNTER — Ambulatory Visit: Admitting: Family Medicine

## 2024-06-04 VITALS — BP 138/80 | HR 77 | Temp 98.2°F | Ht 63.5 in | Wt 206.0 lb

## 2024-06-04 DIAGNOSIS — M545 Low back pain, unspecified: Secondary | ICD-10-CM | POA: Diagnosis not present

## 2024-06-04 DIAGNOSIS — E782 Mixed hyperlipidemia: Secondary | ICD-10-CM | POA: Diagnosis not present

## 2024-06-04 DIAGNOSIS — G8929 Other chronic pain: Secondary | ICD-10-CM | POA: Diagnosis not present

## 2024-06-04 DIAGNOSIS — K219 Gastro-esophageal reflux disease without esophagitis: Secondary | ICD-10-CM

## 2024-06-04 DIAGNOSIS — Z23 Encounter for immunization: Secondary | ICD-10-CM

## 2024-06-04 DIAGNOSIS — I10 Essential (primary) hypertension: Secondary | ICD-10-CM

## 2024-06-04 NOTE — Assessment & Plan Note (Addendum)
 Managed with atorvastatin  10 mg daily. Cholesterol levels good in June. Repeat test needed for efficacy and liver/kidney function due to statin use. - Order repeat cholesterol test. - Check liver and kidney function tests. - Continue atorvastatin  10 mg daily. - Send prescription refills to [facility]. Orders:   Lipid panel   Comprehensive metabolic panel with GFR

## 2024-06-04 NOTE — Assessment & Plan Note (Addendum)
 Well-controlled with lisinopril  10 mg daily. Home BP readings normal, around 130s. - Continue lisinopril  10 mg daily. - Send prescription refills to [facility].
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
## 2024-06-05 LAB — COMPREHENSIVE METABOLIC PANEL WITH GFR
ALT: 21 IU/L (ref 0–32)
AST: 20 IU/L (ref 0–40)
Albumin: 4.4 g/dL (ref 3.9–4.9)
Alkaline Phosphatase: 112 IU/L (ref 49–135)
BUN/Creatinine Ratio: 17 (ref 12–28)
BUN: 12 mg/dL (ref 8–27)
Bilirubin Total: 0.5 mg/dL (ref 0.0–1.2)
CO2: 27 mmol/L (ref 20–29)
Calcium: 9.6 mg/dL (ref 8.7–10.3)
Chloride: 99 mmol/L (ref 96–106)
Creatinine, Ser: 0.72 mg/dL (ref 0.57–1.00)
Globulin, Total: 2.7 g/dL (ref 1.5–4.5)
Glucose: 79 mg/dL (ref 70–99)
Potassium: 4.5 mmol/L (ref 3.5–5.2)
Sodium: 139 mmol/L (ref 134–144)
Total Protein: 7.1 g/dL (ref 6.0–8.5)
eGFR: 93 mL/min/1.73 (ref >59)

## 2024-06-05 LAB — LIPID PANEL
Chol/HDL Ratio: 2.2 ratio (ref 0.0–4.4)
Cholesterol, Total: 160 mg/dL (ref 100–199)
HDL: 73 mg/dL (ref >39)
LDL Chol Calc (NIH): 73 mg/dL (ref 0–99)
Triglycerides: 76 mg/dL (ref 0–149)
VLDL Cholesterol Cal: 14 mg/dL (ref 5–40)

## 2024-06-07 ENCOUNTER — Ambulatory Visit: Payer: Self-pay | Admitting: Family Medicine

## 2024-06-07 DIAGNOSIS — M545 Low back pain, unspecified: Secondary | ICD-10-CM | POA: Insufficient documentation

## 2024-06-07 DIAGNOSIS — K219 Gastro-esophageal reflux disease without esophagitis: Secondary | ICD-10-CM | POA: Insufficient documentation

## 2024-06-07 NOTE — Assessment & Plan Note (Signed)
 Symptoms well-controlled with omeprazole 40 mg PRN, used infrequently after spicy foods. - Continue omeprazole 40 mg PRN.

## 2024-06-07 NOTE — Assessment & Plan Note (Signed)
 Chronic back pain with nerve involvement Managed with gabapentin  300 mg TID. Effectiveness uncertain but continued for nerve damage. Occasional nerve sensations, no significant leg pain. - Continue gabapentin  300 mg TID. - Use acetaminophen  PRN for additional pain. - Send prescription refills to [facility].

## 2024-06-16 DIAGNOSIS — M4712 Other spondylosis with myelopathy, cervical region: Secondary | ICD-10-CM | POA: Diagnosis not present

## 2024-06-16 DIAGNOSIS — M9905 Segmental and somatic dysfunction of pelvic region: Secondary | ICD-10-CM | POA: Diagnosis not present

## 2024-06-16 DIAGNOSIS — M9901 Segmental and somatic dysfunction of cervical region: Secondary | ICD-10-CM | POA: Diagnosis not present

## 2024-06-16 DIAGNOSIS — M542 Cervicalgia: Secondary | ICD-10-CM | POA: Diagnosis not present

## 2024-06-16 DIAGNOSIS — M9903 Segmental and somatic dysfunction of lumbar region: Secondary | ICD-10-CM | POA: Diagnosis not present

## 2024-06-16 DIAGNOSIS — M9902 Segmental and somatic dysfunction of thoracic region: Secondary | ICD-10-CM | POA: Diagnosis not present

## 2024-06-30 DIAGNOSIS — M9901 Segmental and somatic dysfunction of cervical region: Secondary | ICD-10-CM | POA: Diagnosis not present

## 2024-06-30 DIAGNOSIS — M9902 Segmental and somatic dysfunction of thoracic region: Secondary | ICD-10-CM | POA: Diagnosis not present

## 2024-06-30 DIAGNOSIS — M542 Cervicalgia: Secondary | ICD-10-CM | POA: Diagnosis not present

## 2024-06-30 DIAGNOSIS — M9903 Segmental and somatic dysfunction of lumbar region: Secondary | ICD-10-CM | POA: Diagnosis not present

## 2024-06-30 DIAGNOSIS — M9905 Segmental and somatic dysfunction of pelvic region: Secondary | ICD-10-CM | POA: Diagnosis not present

## 2024-06-30 DIAGNOSIS — M4712 Other spondylosis with myelopathy, cervical region: Secondary | ICD-10-CM | POA: Diagnosis not present

## 2024-07-21 DIAGNOSIS — H25813 Combined forms of age-related cataract, bilateral: Secondary | ICD-10-CM | POA: Diagnosis not present

## 2024-10-06 ENCOUNTER — Ambulatory Visit: Admitting: Family Medicine
